# Patient Record
Sex: Male | Born: 1955 | Race: White | Hispanic: No | Marital: Married | Smoking: Never smoker
Health system: Southern US, Community
[De-identification: ages and names within clinical notes are randomized; demographics above are authoritative.]

## PROBLEM LIST (undated history)

## (undated) DIAGNOSIS — N200 Calculus of kidney: Secondary | ICD-10-CM

## (undated) DIAGNOSIS — M109 Gout, unspecified: Secondary | ICD-10-CM

## (undated) DIAGNOSIS — B370 Candidal stomatitis: Secondary | ICD-10-CM

## (undated) HISTORY — PX: BACK SURGERY: SHX140

## (undated) HISTORY — PX: ANTERIOR CERVICAL DECOMP/DISCECTOMY FUSION: SHX1161

## (undated) HISTORY — PX: KIDNEY STONE SURGERY: SHX686

---

## 2014-06-06 ENCOUNTER — Telehealth: Payer: Self-pay | Admitting: Internal Medicine

## 2014-06-06 NOTE — Telephone Encounter (Signed)
Closed encounter °

## 2014-06-09 ENCOUNTER — Emergency Department (HOSPITAL_COMMUNITY): Payer: BC Managed Care – PPO

## 2014-06-09 ENCOUNTER — Encounter (HOSPITAL_COMMUNITY): Payer: Self-pay | Admitting: Emergency Medicine

## 2014-06-09 ENCOUNTER — Emergency Department (HOSPITAL_COMMUNITY)
Admission: EM | Admit: 2014-06-09 | Discharge: 2014-06-10 | Disposition: A | Discharge status: Home / Self Care | Payer: BC Managed Care – PPO | Attending: Emergency Medicine | Admitting: Emergency Medicine

## 2014-06-09 DIAGNOSIS — R21 Rash and other nonspecific skin eruption: Secondary | ICD-10-CM | POA: Insufficient documentation

## 2014-06-09 DIAGNOSIS — R079 Chest pain, unspecified: Secondary | ICD-10-CM | POA: Insufficient documentation

## 2014-06-09 DIAGNOSIS — R059 Cough, unspecified: Secondary | ICD-10-CM

## 2014-06-09 DIAGNOSIS — R05 Cough: Secondary | ICD-10-CM

## 2014-06-09 DIAGNOSIS — Z79899 Other long term (current) drug therapy: Secondary | ICD-10-CM | POA: Insufficient documentation

## 2014-06-09 DIAGNOSIS — IMO0002 Reserved for concepts with insufficient information to code with codable children: Secondary | ICD-10-CM | POA: Diagnosis not present

## 2014-06-09 DIAGNOSIS — Z87442 Personal history of urinary calculi: Secondary | ICD-10-CM | POA: Insufficient documentation

## 2014-06-09 HISTORY — DX: Calculus of kidney: N20.0

## 2014-06-09 LAB — BASIC METABOLIC PANEL
Anion gap: 14 (ref 5–15)
BUN: 21 mg/dL (ref 6–23)
CALCIUM: 9.6 mg/dL (ref 8.4–10.5)
CHLORIDE: 98 meq/L (ref 96–112)
CO2: 26 mEq/L (ref 19–32)
Creatinine, Ser: 0.77 mg/dL (ref 0.50–1.35)
GFR calc Af Amer: >90 mL/min (ref >90)
GFR calc non Af Amer: >90 mL/min (ref >90)
Glucose, Bld: 220 mg/dL — ABNORMAL HIGH (ref 70–99)
Potassium: 5.1 mEq/L (ref 3.7–5.3)
Sodium: 138 mEq/L (ref 137–147)

## 2014-06-09 LAB — CBC
HEMATOCRIT: 47 % (ref 39.0–52.0)
Hemoglobin: 16.7 g/dL (ref 13.0–17.0)
MCH: 32.4 pg (ref 26.0–34.0)
MCHC: 35.5 g/dL (ref 30.0–36.0)
MCV: 91.1 fL (ref 78.0–100.0)
Platelets: 144 10*3/uL — ABNORMAL LOW (ref 150–400)
RBC: 5.16 MIL/uL (ref 4.22–5.81)
RDW: 12.7 % (ref 11.5–15.5)
WBC: 7.4 10*3/uL (ref 4.0–10.5)

## 2014-06-09 LAB — I-STAT TROPONIN, ED: Troponin i, poc: 0.01 ng/mL (ref 0.00–0.08)

## 2014-06-09 NOTE — ED Notes (Signed)
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) and was up in [place] working about 3 weeks ago and while he was there he started developing a rash on August 13th   Pt went to a walk in clinic on Aug 17th and got a steroid shot and 6 days worth of prednisone  Pt took 5 days of the prednisone due to side effects  Aug 22nd pt developed chills and muscle cramping all over his body   Pt went to [facility] around the 25th of August and was referred to a dermatologist  Pt had blood work for lyme disease and many other things all came back negative  Pt saw the dermatologist and was placed back on prednisone  Pt states during this whole time his chest has not been feeling right but no one has checked his chest they have concentrated on the rash and other symptoms  Pt states he has periods of shortness of breath and has had a dry nonproductive cough   Pt states today the chest discomfort has gotten worse

## 2014-06-10 ENCOUNTER — Emergency Department (HOSPITAL_COMMUNITY): Payer: BC Managed Care – PPO

## 2014-06-10 ENCOUNTER — Encounter (HOSPITAL_COMMUNITY): Payer: Self-pay | Admitting: Radiology

## 2014-06-10 LAB — D-DIMER, QUANTITATIVE (NOT AT ARMC): D DIMER QUANT: 0.9 ug{FEU}/mL — AB (ref 0.00–0.48)

## 2014-06-10 MED ORDER — DESLORATADINE 5 MG PO TABS: 5.0000 mg | ORAL_TABLET | Freq: Every day | ORAL | Status: DC

## 2014-06-10 MED ORDER — IOHEXOL 350 MG/ML SOLN
100.0000 mL | Freq: Once | INTRAVENOUS | Status: AC | PRN
  Administered 2014-06-10: 100 mL via INTRAVENOUS

## 2014-06-10 MED ORDER — IOHEXOL 300 MG/ML  SOLN: 100.0000 mL | Freq: Once | INTRAMUSCULAR | Status: DC | PRN

## 2014-06-10 MED ORDER — KETOROLAC TROMETHAMINE 30 MG/ML IJ SOLN
30.0000 mg | Freq: Once | INTRAMUSCULAR | Status: AC
  Administered 2014-06-10: 30 mg via INTRAVENOUS
  Filled 2014-06-10: qty 1

## 2014-06-10 MED ORDER — IBUPROFEN 800 MG PO TABS: 800.0000 mg | ORAL_TABLET | Freq: Three times a day (TID) | ORAL | Status: AC

## 2014-06-10 NOTE — Discharge Instructions (Signed)
Cough, Adult   A cough is a reflex. It helps you clear your throat and airways. A cough can help heal your body. A cough can last 2 or 3 weeks (acute) or may last more than 8 weeks (chronic). Some common causes of a cough can include an infection, allergy, or a cold.  HOME CARE  · Only take medicine as told by your doctor.  · If given, take your medicines (antibiotics) as told. Finish them even if you start to feel better.  · Use a cold steam vaporizer or humidifier in your home. This can help loosen thick spit (secretions).  · Sleep so you are almost sitting up (semi-upright). Use pillows to do this. This helps reduce coughing.  · Rest as needed.  · Stop smoking if you smoke.  GET HELP RIGHT AWAY IF:  · You have yellowish-white fluid (pus) in your thick spit.  · Your cough gets worse.  · Your medicine does not reduce coughing, and you are losing sleep.  · You cough up blood.  · You have trouble breathing.  · Your pain gets worse and medicine does not help.  · You have a fever.  MAKE SURE YOU:   · Understand these instructions.  · Will watch your condition.  · Will get help right away if you are not doing well or get worse.  Document Released: 06/02/2011 Document Revised: 02/03/2014 Document Reviewed: 06/02/2011  ExitCare® Patient Information ©2015 [employer]. This information is not intended to replace advice given to you by your health care provider. Make sure you discuss any questions you have with your health care provider.

## 2014-06-10 NOTE — ED Provider Notes (Signed)
CSN: [id number]     Arrival date & time 06/09/14  1936 History   First MD Initiated Contact with Patient 06/09/14 2350     Chief Complaint  Patient presents with  . Chest Pain     (Consider location/radiation/quality/duration/timing/severity/associated sxs/prior Treatment) Patient is a 58 y.o. male presenting with chest pain. The history is provided by the patient.  Chest Pain Pain location:  R chest Pain quality: aching   Pain radiates to:  Does not radiate Pain radiates to the back: no   Pain severity:  Mild Onset quality:  Gradual Duration:  2 days Timing:  Constant Progression:  Unchanged Chronicity:  New Context: breathing   Relieved by:  Nothing Worsened by:  Nothing tried Ineffective treatments:  None tried Associated symptoms: cough   Associated symptoms: no diaphoresis, no fever, no lower extremity edema and not vomiting   Associated symptoms comment:  Rashes on the skin x 3 weeks, seen by derm and on 3 weeks of prednisone Cough:    Cough characteristics:  Non-productive Risk factors: no aortic disease     Past Medical History  Diagnosis Date  . Kidney stone    Past Surgical History  Procedure Laterality Date  . Kindney stone surgery    . Cervical disc surgery     Family History  Problem Relation Age of Onset  . Diabetes Other    History  Substance Use Topics  . Smoking status: Never Smoker   . Smokeless tobacco: Not on file  . Alcohol Use: No    Review of Systems  Constitutional: Negative for fever and diaphoresis.  Respiratory: Positive for cough.   Cardiovascular: Positive for chest pain. Negative for leg swelling.  Gastrointestinal: Negative for vomiting.  Skin: Positive for rash.  All other systems reviewed and are negative.     Allergies  Review of patient's allergies indicates no known allergies.  Home Medications   Prior to Admission medications   Medication Sig Start Date End Date Taking? Authorizing Provider  acetaminophen  (TYLENOL) 325 MG tablet Take 650 mg by mouth 2 (two) times daily.   Yes Historical Provider, MD  mometasone (ELOCON) 0.1 % ointment Apply 1 application topically daily. To affected areas.   Yes Historical Provider, MD  predniSONE (DELTASONE) 5 MG tablet Take 5 mg by mouth daily with breakfast. Taper down beginning with 12 tablets for 2 days, 11 tabs x2 days, 10 x2 days...etc until gone.   Yes Historical Provider, MD   BP 128/80  Pulse 80  Temp(Src) 98.7 F (37.1 C) (Oral)  Resp 18  SpO2 95% Physical Exam  Constitutional: He is oriented to person, place, and time. He appears well-developed and well-nourished. No distress.  HENT:  Head: Normocephalic and atraumatic.  Mouth/Throat: Oropharynx is clear and moist.  Eyes: Conjunctivae are normal. Pupils are equal, round, and reactive to light.  Neck: Normal range of motion. Neck supple.  Cardiovascular: Normal rate, regular rhythm and intact distal pulses.   Pulmonary/Chest: Effort normal and breath sounds normal. No stridor. No respiratory distress. He has no wheezes. He has no rales.  Abdominal: Soft. Bowel sounds are normal. There is no tenderness. There is no rebound and no guarding.  Musculoskeletal: Normal range of motion. He exhibits no edema and no tenderness.  Neurological: He is alert and oriented to person, place, and time. He has normal reflexes.  Skin: Skin is warm and dry. Rash noted.  On elbows, red and scaly No osler nodes no janeway lesions.  No splinter  hemorrhages    ED Course  Procedures (including critical care time) Labs Review Labs Reviewed  CBC - Abnormal; Notable for the following:    Platelets 144 (*)    All other components within normal limits  BASIC METABOLIC PANEL - Abnormal; Notable for the following:    Glucose, Bld 220 (*)    All other components within normal limits  I-STAT TROPOININ, ED    Imaging Review Dg Chest 2 View  06/09/2014   CLINICAL DATA:  Sharp mid chest pain with inspiration  EXAM:  CHEST  2 VIEW  COMPARISON:  05/20/2010  FINDINGS: Mild bibasilar opacities, likely atelectasis. No pleural effusion or pneumothorax.  The heart is normal in size.  Mild degenerative changes of the visualized thoracolumbar spine. Cervical spine fixation hardware.  IMPRESSION: Mild bibasilar opacities, likely atelectasis.   Electronically Signed   By: Charline Bills M.D.   On: 06/09/2014 20:18     EKG Interpretation   Date/Time:  Monday June 09 2014 19:46:38 EDT Ventricular Rate:  84 PR Interval:  140 QRS Duration: 82 QT Interval:  354 QTC Calculation: 418 R Axis:   43 Text Interpretation:  Sinus rhythm Confirmed by [facility]  MD, Brylin Stanislawski  ([id number]) on 06/10/2014 12:26:58 AM      MDM   Final diagnoses:  None    Negative for PE, ruled out for ACS based on EKG and troponin and time course of > 8 hours of symptoms.  Will treat with NSAIDs and zyrtec and have patient follow up with PMD for ongoing testing of rash and symptoms.  Patient and wife verbalize understanding and agree to follow up    Glenn Sayegh Smitty Cords, MD 06/10/14 [phone]

## 2014-07-04 ENCOUNTER — Ambulatory Visit: Payer: BC Managed Care – PPO | Admitting: Infectious Diseases

## 2014-07-04 ENCOUNTER — Emergency Department (HOSPITAL_COMMUNITY)
Admission: EM | Admit: 2014-07-04 | Discharge: 2014-07-04 | Disposition: A | Discharge status: Home / Self Care | Payer: BC Managed Care – PPO | Attending: Emergency Medicine | Admitting: Emergency Medicine

## 2014-07-04 ENCOUNTER — Emergency Department (HOSPITAL_COMMUNITY): Payer: BC Managed Care – PPO

## 2014-07-04 ENCOUNTER — Encounter (HOSPITAL_COMMUNITY): Payer: Self-pay | Admitting: Emergency Medicine

## 2014-07-04 DIAGNOSIS — K1379 Other lesions of oral mucosa: Secondary | ICD-10-CM | POA: Insufficient documentation

## 2014-07-04 DIAGNOSIS — R21 Rash and other nonspecific skin eruption: Secondary | ICD-10-CM | POA: Diagnosis not present

## 2014-07-04 DIAGNOSIS — E860 Dehydration: Secondary | ICD-10-CM | POA: Diagnosis not present

## 2014-07-04 DIAGNOSIS — R945 Abnormal results of liver function studies: Secondary | ICD-10-CM | POA: Diagnosis not present

## 2014-07-04 DIAGNOSIS — Z87442 Personal history of urinary calculi: Secondary | ICD-10-CM | POA: Diagnosis not present

## 2014-07-04 DIAGNOSIS — Z791 Long term (current) use of non-steroidal anti-inflammatories (NSAID): Secondary | ICD-10-CM | POA: Diagnosis not present

## 2014-07-04 DIAGNOSIS — R7989 Other specified abnormal findings of blood chemistry: Secondary | ICD-10-CM

## 2014-07-04 DIAGNOSIS — R531 Weakness: Secondary | ICD-10-CM | POA: Diagnosis present

## 2014-07-04 DIAGNOSIS — Z79899 Other long term (current) drug therapy: Secondary | ICD-10-CM | POA: Diagnosis not present

## 2014-07-04 LAB — URINALYSIS, ROUTINE W REFLEX MICROSCOPIC
Glucose, UA: NEGATIVE mg/dL
HGB URINE DIPSTICK: NEGATIVE
Ketones, ur: 15 mg/dL — AB
Leukocytes, UA: NEGATIVE
Nitrite: NEGATIVE
Protein, ur: 100 mg/dL — AB
Specific Gravity, Urine: 1.023 (ref 1.005–1.030)
UROBILINOGEN UA: 2 mg/dL — AB (ref 0.0–1.0)
pH: 6 (ref 5.0–8.0)

## 2014-07-04 LAB — CBC WITH DIFFERENTIAL/PLATELET
BASOS PCT: 1 % (ref 0–1)
Basophils Absolute: 0.1 10*3/uL (ref 0.0–0.1)
Eosinophils Absolute: 0.2 10*3/uL (ref 0.0–0.7)
Eosinophils Relative: 4 % (ref 0–5)
HEMATOCRIT: 39.9 % (ref 39.0–52.0)
Hemoglobin: 14.2 g/dL (ref 13.0–17.0)
LYMPHS PCT: 18 % (ref 12–46)
Lymphs Abs: 1.1 10*3/uL (ref 0.7–4.0)
MCH: 32.3 pg (ref 26.0–34.0)
MCHC: 35.6 g/dL (ref 30.0–36.0)
MCV: 90.9 fL (ref 78.0–100.0)
MONOS PCT: 16 % — AB (ref 3–12)
Monocytes Absolute: 1 10*3/uL (ref 0.1–1.0)
NEUTROS ABS: 3.7 10*3/uL (ref 1.7–7.7)
Neutrophils Relative %: 61 % (ref 43–77)
Platelets: 152 10*3/uL (ref 150–400)
RBC: 4.39 MIL/uL (ref 4.22–5.81)
RDW: 13.6 % (ref 11.5–15.5)
WBC: 6.1 10*3/uL (ref 4.0–10.5)

## 2014-07-04 LAB — COMPREHENSIVE METABOLIC PANEL
ALT: 151 U/L — AB (ref 0–53)
AST: 265 U/L — ABNORMAL HIGH (ref 0–37)
Albumin: 2.3 g/dL — ABNORMAL LOW (ref 3.5–5.2)
Alkaline Phosphatase: 129 U/L — ABNORMAL HIGH (ref 39–117)
Anion gap: 10 (ref 5–15)
BUN: 13 mg/dL (ref 6–23)
CO2: 26 meq/L (ref 19–32)
Calcium: 8.4 mg/dL (ref 8.4–10.5)
Chloride: 99 mEq/L (ref 96–112)
Creatinine, Ser: 0.83 mg/dL (ref 0.50–1.35)
GFR calc non Af Amer: >90 mL/min (ref >90)
GLUCOSE: 116 mg/dL — AB (ref 70–99)
POTASSIUM: 3.9 meq/L (ref 3.7–5.3)
SODIUM: 135 meq/L — AB (ref 137–147)
Total Bilirubin: 0.9 mg/dL (ref 0.3–1.2)
Total Protein: 6.4 g/dL (ref 6.0–8.3)

## 2014-07-04 LAB — URINE MICROSCOPIC-ADD ON

## 2014-07-04 MED ORDER — GI COCKTAIL ~~LOC~~
30.0000 mL | Freq: Once | ORAL | Status: AC
  Administered 2014-07-04: 30 mL via ORAL
  Filled 2014-07-04: qty 30

## 2014-07-04 MED ORDER — IOHEXOL 300 MG/ML  SOLN
100.0000 mL | Freq: Once | INTRAMUSCULAR | Status: AC | PRN
  Administered 2014-07-04: 100 mL via INTRAVENOUS

## 2014-07-04 MED ORDER — DIPHENHYDRAMINE HCL 50 MG/ML IJ SOLN
25.0000 mg | Freq: Once | INTRAMUSCULAR | Status: AC
  Administered 2014-07-04: 25 mg via INTRAVENOUS
  Filled 2014-07-04: qty 1

## 2014-07-04 MED ORDER — IOHEXOL 300 MG/ML  SOLN
25.0000 mL | INTRAMUSCULAR | Status: AC
  Administered 2014-07-04: 25 mL via ORAL

## 2014-07-04 MED ORDER — SODIUM CHLORIDE 0.9 % IV BOLUS (SEPSIS)
2000.0000 mL | Freq: Once | INTRAVENOUS | Status: AC
  Administered 2014-07-04: 2000 mL via INTRAVENOUS

## 2014-07-04 NOTE — Discharge Instructions (Signed)
Stay hydrated.   Continue current meds.   Follow up with your specialists for further workup.   Return to ER if you have fever, worse rash, dehydration, vomiting.

## 2014-07-04 NOTE — ED Notes (Signed)
Oral contrast completed.  CT called.  

## 2014-07-04 NOTE — ED Notes (Signed)
Pt from home for rash to back, bilateral arms, and legs x months. Pt states that he also has sores in his mouth causing him not to be able to eat or drink. Reports some weakness and fatigue. Pt also reports some nausea. Denies any fevers. Nad noted.

## 2014-07-04 NOTE — ED Notes (Signed)
Pt in stating he has had multiple symptoms over the last 5 weeks and has been to several specialists for this, c/o mouth sores, fatigue, generalized rash, has been told he may have coxsackie virus. Pt has been losing weight, decreased PO intake, pt alert and oriented. Pt here for further testing.

## 2014-07-04 NOTE — ED Provider Notes (Signed)
CSN: [id number]     Arrival date & time 07/04/14  1339 History   First MD Initiated Contact with Patient 07/04/14 1441     Chief Complaint  Patient presents with  . Mouth Lesions  . Weakness     (Consider location/radiation/quality/duration/timing/severity/associated sxs/prior Treatment) The history is provided by the patient.  Glenn Cooper is a 58 y.o. male hx of kidney stone here with rash. Rash on torso and back and mouth for the last 5-6 weeks. He has been seen multiple times with several specialists, including ID and rheumatologist. He has negative lyme titers and had elevated LFTs. He also had neg HIV test and neg mono. He has mouth sores that are not getting better despite being on magic mouth wash. He is unable to eat much and feels fatigued. States about 10 pound weight loss over the last month. He was told that he might have coxsackie virus. About 2 weeks ago, he was diagnosed with sinusitis and is currently on augmentin.    Past Medical History  Diagnosis Date  . Kidney stone    Past Surgical History  Procedure Laterality Date  . Kindney stone surgery    . Cervical disc surgery     Family History  Problem Relation Age of Onset  . Diabetes Other    History  Substance Use Topics  . Smoking status: Never Smoker   . Smokeless tobacco: Not on file  . Alcohol Use: No    Review of Systems  HENT: Positive for mouth sores.   Skin: Positive for rash.  Neurological: Positive for weakness.  All other systems reviewed and are negative.     Allergies  Review of patient's allergies indicates no known allergies.  Home Medications   Prior to Admission medications   Medication Sig Start Date End Date Taking? Authorizing Provider  acetaminophen (TYLENOL) 325 MG tablet Take 650 mg by mouth every 4 (four) hours as needed for mild pain.    Yes Historical Provider, MD  Alum & Mag Hydroxide-Simeth (MAGIC MOUTHWASH W/LIDOCAINE) SOLN Take 10 mLs by mouth 3 (three) times daily as  needed for mouth pain.   Yes Historical Provider, MD  amoxicillin-clavulanate (AUGMENTIN) 875-125 MG per tablet Take 1 tablet by mouth 2 (two) times daily. For 7 days 06/27/14  Yes Historical Provider, MD  benzonatate (TESSALON) 200 MG capsule Take 200 mg by mouth 3 (three) times daily as needed for cough.   Yes Historical Provider, MD  desloratadine (CLARINEX) 5 MG tablet Take 5 mg by mouth daily as needed (allergies).   Yes Historical Provider, MD  guaiFENesin (MUCINEX) 600 MG 12 hr tablet Take 600 mg by mouth 2 (two) times daily as needed for cough.   Yes Historical Provider, MD  ibuprofen (ADVIL,MOTRIN) 800 MG tablet Take 1 tablet (800 mg total) by mouth 3 (three) times daily. 06/10/14  Yes April K Palumbo-Rasch, MD  silver sulfADIAZINE (SILVADENE) 1 % cream Apply 1 application topically 2 (two) times daily.   Yes Historical Provider, MD   BP 113/95  Pulse 87  Temp(Src) 98.8 F (37.1 C) (Oral)  Resp 22  Ht 6\' 4"  (1.93 m)  Wt 214 lb (97.07 kg)  BMI 26.06 kg/m2  SpO2 95% Physical Exam  Nursing note and vitals reviewed. Constitutional: He is oriented to person, place, and time.  Dehydrated   HENT:  Head: Normocephalic.  MM slightly dry. Several oral lesions.   Eyes: Conjunctivae are normal. Pupils are equal, round, and reactive to light.  Neck: Normal  range of motion. Neck supple.  Cardiovascular: Normal rate, regular rhythm and normal heart sounds.   Pulmonary/Chest: Effort normal and breath sounds normal. No respiratory distress. He has no wheezes. He has no rales.  Abdominal: Soft. Bowel sounds are normal. He exhibits no distension. There is no tenderness. There is no rebound.  Musculoskeletal: Normal range of motion. He exhibits no edema and no tenderness.  Neurological: He is alert and oriented to person, place, and time.  Skin:  Diffuse macular papular rash, no desquamation.   Psychiatric: He has a normal mood and affect. His behavior is normal. Judgment and thought content  normal.    ED Course  Procedures (including critical care time) Labs Review Labs Reviewed  CBC WITH DIFFERENTIAL - Abnormal; Notable for the following:    Monocytes Relative 16 (*)    All other components within normal limits  COMPREHENSIVE METABOLIC PANEL - Abnormal; Notable for the following:    Sodium 135 (*)    Glucose, Bld 116 (*)    Albumin 2.3 (*)    AST 265 (*)    ALT 151 (*)    Alkaline Phosphatase 129 (*)    All other components within normal limits  URINALYSIS, ROUTINE W REFLEX MICROSCOPIC - Abnormal; Notable for the following:    Color, Urine AMBER (*)    Bilirubin Urine SMALL (*)    Ketones, ur 15 (*)    Protein, ur 100 (*)    Urobilinogen, UA 2.0 (*)    All other components within normal limits  URINE MICROSCOPIC-ADD ON - Abnormal; Notable for the following:    Squamous Epithelial / LPF FEW (*)    Bacteria, UA FEW (*)    Casts GRANULAR CAST (*)    All other components within normal limits  MONONUCLEOSIS SCREEN    Imaging Review Ct Abdomen Pelvis W Contrast  07/04/2014   CLINICAL DATA:  Lower bilateral pain. Elevated liver function tests.  EXAM: CT ABDOMEN AND PELVIS WITH CONTRAST  TECHNIQUE: Multidetector CT imaging of the abdomen and pelvis was performed using the standard protocol following bolus administration of intravenous contrast.  CONTRAST:  100mL OMNIPAQUE IOHEXOL 300 MG/ML  SOLN  COMPARISON:  07/09/2012  FINDINGS: Lower Chest:  Unremarkable.  Hepatobiliary: Several tiny sub-cm hepatic cysts remain stable. No liver masses are identified. Focal fatty infiltration seen adjacent to the falciform ligament. Gallbladder is unremarkable.  Pancreas: No mass, inflammatory changes, or other parenchymal abnormality identified.  Spleen:  Within normal limits in size and appearance.  Adrenal Glands:  No mass identified.  Kidneys/Urinary Tract: No masses identified. A few tiny less than 5 mm nonobstructive calculi are seen in the upper and midpole of the right kidney. No  evidence of hydronephrosis. No evidence of ureteral calculi or dilatation.  Stomach/Bowel/Peritoneum: No evidence of wall thickening, mass, or obstruction.  Vascular/Lymphatic: No pathologically enlarged lymph nodes identified. No other significant abnormality identified.  Reproductive:  No mass or other significant abnormality identified.  Other:  None.  Musculoskeletal:  No suspicious bone lesions identified.  IMPRESSION: No acute findings within the abdomen or pelvis.  Nonobstructive right nephrolithiasis.   Electronically Signed   By: Myles RosenthalJohn  Stahl M.D.   On: 07/04/2014 16:10     EKG Interpretation None      MDM   Final diagnoses:  None   Miguel Janee Mornhompson is a 58 y.o. male here with rash, elevated LFTs. I doubt steven johnson's syndrome. Given weight loss and elevated LFTs, will get CT ab/pel to r/o occult cancer.  Will hydrate and reassess.   4:22 PM CT unremarkable. LFTs slightly elevated, abdomen nontender. Hydrated in the ED, felt better. Will d/c home.    Richardean Canal, MD 07/04/14 [phone]

## 2014-07-04 NOTE — ED Notes (Signed)
Brought pt back to room with family in tow; pt given a gown to get in to; Duwayne Heckanielle, RN, Felipa Etherrence, RN and Silverio LayYao, MD present in room

## 2014-07-08 ENCOUNTER — Encounter (HOSPITAL_COMMUNITY): Payer: Self-pay | Admitting: Emergency Medicine

## 2014-07-08 ENCOUNTER — Inpatient Hospital Stay (HOSPITAL_COMMUNITY): Payer: BC Managed Care – PPO

## 2014-07-08 ENCOUNTER — Inpatient Hospital Stay (HOSPITAL_COMMUNITY)
Admission: EM | Admit: 2014-07-08 | Discharge: 2014-07-11 | DRG: 640 | Disposition: A | Discharge status: Home / Self Care | Payer: BC Managed Care – PPO | Attending: Family Medicine | Admitting: Family Medicine

## 2014-07-08 ENCOUNTER — Encounter: Payer: Self-pay | Admitting: Internal Medicine

## 2014-07-08 ENCOUNTER — Ambulatory Visit (INDEPENDENT_AMBULATORY_CARE_PROVIDER_SITE_OTHER): Payer: BC Managed Care – PPO | Admitting: Internal Medicine

## 2014-07-08 VITALS — BP 81/63 | HR 96 | Ht 73.0 in | Wt 212.1 lb

## 2014-07-08 DIAGNOSIS — Z6828 Body mass index (BMI) 28.0-28.9, adult: Secondary | ICD-10-CM | POA: Diagnosis not present

## 2014-07-08 DIAGNOSIS — Z809 Family history of malignant neoplasm, unspecified: Secondary | ICD-10-CM

## 2014-07-08 DIAGNOSIS — R0609 Other forms of dyspnea: Secondary | ICD-10-CM

## 2014-07-08 DIAGNOSIS — B370 Candidal stomatitis: Secondary | ICD-10-CM | POA: Diagnosis present

## 2014-07-08 DIAGNOSIS — I959 Hypotension, unspecified: Secondary | ICD-10-CM | POA: Diagnosis present

## 2014-07-08 DIAGNOSIS — E43 Unspecified severe protein-calorie malnutrition: Secondary | ICD-10-CM | POA: Diagnosis present

## 2014-07-08 DIAGNOSIS — Z87442 Personal history of urinary calculi: Secondary | ICD-10-CM

## 2014-07-08 DIAGNOSIS — R634 Abnormal weight loss: Secondary | ICD-10-CM

## 2014-07-08 DIAGNOSIS — R5383 Other fatigue: Secondary | ICD-10-CM

## 2014-07-08 DIAGNOSIS — M109 Gout, unspecified: Secondary | ICD-10-CM | POA: Diagnosis present

## 2014-07-08 DIAGNOSIS — M3390 Dermatopolymyositis, unspecified, organ involvement unspecified: Secondary | ICD-10-CM | POA: Diagnosis present

## 2014-07-08 DIAGNOSIS — I951 Orthostatic hypotension: Secondary | ICD-10-CM

## 2014-07-08 DIAGNOSIS — Z833 Family history of diabetes mellitus: Secondary | ICD-10-CM | POA: Diagnosis not present

## 2014-07-08 DIAGNOSIS — R21 Rash and other nonspecific skin eruption: Secondary | ICD-10-CM

## 2014-07-08 DIAGNOSIS — J189 Pneumonia, unspecified organism: Secondary | ICD-10-CM | POA: Diagnosis present

## 2014-07-08 DIAGNOSIS — Z981 Arthrodesis status: Secondary | ICD-10-CM

## 2014-07-08 DIAGNOSIS — E860 Dehydration: Secondary | ICD-10-CM | POA: Diagnosis not present

## 2014-07-08 DIAGNOSIS — I517 Cardiomegaly: Secondary | ICD-10-CM

## 2014-07-08 DIAGNOSIS — R079 Chest pain, unspecified: Secondary | ICD-10-CM

## 2014-07-08 DIAGNOSIS — Z8719 Personal history of other diseases of the digestive system: Secondary | ICD-10-CM

## 2014-07-08 DIAGNOSIS — R0600 Dyspnea, unspecified: Secondary | ICD-10-CM

## 2014-07-08 HISTORY — DX: Gout, unspecified: M10.9

## 2014-07-08 HISTORY — DX: Candidal stomatitis: B37.0

## 2014-07-08 LAB — COMPREHENSIVE METABOLIC PANEL
ALBUMIN: 2.3 g/dL — AB (ref 3.5–5.2)
ALT: 103 U/L — AB (ref 0–53)
AST: 191 U/L — AB (ref 0–37)
Alkaline Phosphatase: 123 U/L — ABNORMAL HIGH (ref 39–117)
Anion gap: 12 (ref 5–15)
BUN: 12 mg/dL (ref 6–23)
CALCIUM: 8 mg/dL — AB (ref 8.4–10.5)
CHLORIDE: 98 meq/L (ref 96–112)
CO2: 23 meq/L (ref 19–32)
Creatinine, Ser: 0.87 mg/dL (ref 0.50–1.35)
GFR calc Af Amer: >90 mL/min (ref >90)
GFR calc non Af Amer: >90 mL/min (ref >90)
Glucose, Bld: 116 mg/dL — ABNORMAL HIGH (ref 70–99)
Potassium: 3.5 mEq/L — ABNORMAL LOW (ref 3.7–5.3)
SODIUM: 133 meq/L — AB (ref 137–147)
Total Bilirubin: 1 mg/dL (ref 0.3–1.2)
Total Protein: 6.2 g/dL (ref 6.0–8.3)

## 2014-07-08 LAB — PRO B NATRIURETIC PEPTIDE: PRO B NATRI PEPTIDE: 22.7 pg/mL (ref 0–125)

## 2014-07-08 LAB — CBC WITH DIFFERENTIAL/PLATELET
Basophils Absolute: 0 10*3/uL (ref 0.0–0.1)
Basophils Relative: 1 % (ref 0–1)
EOS ABS: 0.1 10*3/uL (ref 0.0–0.7)
EOS PCT: 2 % (ref 0–5)
HCT: 40.8 % (ref 39.0–52.0)
Hemoglobin: 14.3 g/dL (ref 13.0–17.0)
LYMPHS ABS: 0.8 10*3/uL (ref 0.7–4.0)
Lymphocytes Relative: 13 % (ref 12–46)
MCH: 31.5 pg (ref 26.0–34.0)
MCHC: 35 g/dL (ref 30.0–36.0)
MCV: 89.9 fL (ref 78.0–100.0)
Monocytes Absolute: 1 10*3/uL (ref 0.1–1.0)
Monocytes Relative: 16 % — ABNORMAL HIGH (ref 3–12)
Neutro Abs: 4.7 10*3/uL (ref 1.7–7.7)
Neutrophils Relative %: 70 % (ref 43–77)
Platelets: 137 10*3/uL — ABNORMAL LOW (ref 150–400)
RBC: 4.54 MIL/uL (ref 4.22–5.81)
RDW: 13.9 % (ref 11.5–15.5)
WBC: 6.7 10*3/uL (ref 4.0–10.5)

## 2014-07-08 LAB — URINALYSIS, ROUTINE W REFLEX MICROSCOPIC
GLUCOSE, UA: NEGATIVE mg/dL
HGB URINE DIPSTICK: NEGATIVE
KETONES UR: 40 mg/dL — AB
Leukocytes, UA: NEGATIVE
Nitrite: NEGATIVE
PH: 6.5 (ref 5.0–8.0)
Protein, ur: 100 mg/dL — AB
Specific Gravity, Urine: 1.021 (ref 1.005–1.030)
Urobilinogen, UA: 4 mg/dL — ABNORMAL HIGH (ref 0.0–1.0)

## 2014-07-08 LAB — HEPATITIS PANEL, ACUTE
HCV AB: NEGATIVE
HEP A IGM: NONREACTIVE
Hep B C IgM: NONREACTIVE
Hepatitis B Surface Ag: NEGATIVE

## 2014-07-08 LAB — HEMOGLOBIN A1C
Hgb A1c MFr Bld: 6.5 % — ABNORMAL HIGH (ref <5.7)
Mean Plasma Glucose: 140 mg/dL — ABNORMAL HIGH (ref <117)

## 2014-07-08 LAB — URINE MICROSCOPIC-ADD ON

## 2014-07-08 LAB — CBC
HEMATOCRIT: 37.8 % — AB (ref 39.0–52.0)
Hemoglobin: 13 g/dL (ref 13.0–17.0)
MCH: 31.8 pg (ref 26.0–34.0)
MCHC: 34.4 g/dL (ref 30.0–36.0)
MCV: 92.4 fL (ref 78.0–100.0)
Platelets: 120 10*3/uL — ABNORMAL LOW (ref 150–400)
RBC: 4.09 MIL/uL — ABNORMAL LOW (ref 4.22–5.81)
RDW: 14.2 % (ref 11.5–15.5)
WBC: 5.1 10*3/uL (ref 4.0–10.5)

## 2014-07-08 LAB — CK: CK TOTAL: 235 U/L — AB (ref 7–232)

## 2014-07-08 LAB — CREATININE, SERUM
CREATININE: 0.73 mg/dL (ref 0.50–1.35)
GFR calc Af Amer: >90 mL/min (ref >90)
GFR calc non Af Amer: >90 mL/min (ref >90)

## 2014-07-08 LAB — SAVE SMEAR

## 2014-07-08 LAB — HIV ANTIBODY (ROUTINE TESTING W REFLEX): HIV: NONREACTIVE

## 2014-07-08 LAB — SEDIMENTATION RATE: Sed Rate: 30 mm/hr — ABNORMAL HIGH (ref 0–16)

## 2014-07-08 LAB — C-REACTIVE PROTEIN: CRP: <0.5 mg/dL — ABNORMAL LOW (ref <0.60)

## 2014-07-08 LAB — TSH: TSH: 2.25 u[IU]/mL (ref 0.350–4.500)

## 2014-07-08 LAB — I-STAT CG4 LACTIC ACID, ED: Lactic Acid, Venous: 1.49 mmol/L (ref 0.5–2.2)

## 2014-07-08 MED ORDER — POTASSIUM CHLORIDE CRYS ER 20 MEQ PO TBCR
40.0000 meq | EXTENDED_RELEASE_TABLET | Freq: Two times a day (BID) | ORAL | Status: DC
  Administered 2014-07-08 – 2014-07-10 (×5): 40 meq via ORAL
  Filled 2014-07-08 (×8): qty 2

## 2014-07-08 MED ORDER — HEPARIN SODIUM (PORCINE) 5000 UNIT/ML IJ SOLN
5000.0000 [IU] | Freq: Three times a day (TID) | INTRAMUSCULAR | Status: DC
  Administered 2014-07-08 – 2014-07-11 (×9): 5000 [IU] via SUBCUTANEOUS
  Filled 2014-07-08 (×13): qty 1

## 2014-07-08 MED ORDER — ONDANSETRON HCL 4 MG/2ML IJ SOLN: 4.0000 mg | Freq: Four times a day (QID) | INTRAMUSCULAR | Status: DC | PRN

## 2014-07-08 MED ORDER — AZITHROMYCIN 250 MG PO TABS
250.0000 mg | ORAL_TABLET | Freq: Every day | ORAL | Status: DC
  Filled 2014-07-08: qty 1

## 2014-07-08 MED ORDER — AZITHROMYCIN 500 MG PO TABS
500.0000 mg | ORAL_TABLET | Freq: Once | ORAL | Status: AC
  Administered 2014-07-09: 500 mg via ORAL
  Filled 2014-07-08: qty 1

## 2014-07-08 MED ORDER — LIDOCAINE VISCOUS 2 % MT SOLN
15.0000 mL | OROMUCOSAL | Status: DC | PRN
Start: 2014-07-08 — End: 2014-07-08

## 2014-07-08 MED ORDER — ACETAMINOPHEN 325 MG PO TABS: 650.0000 mg | ORAL_TABLET | Freq: Four times a day (QID) | ORAL | Status: DC | PRN

## 2014-07-08 MED ORDER — SODIUM CHLORIDE 0.9 % IV SOLN
INTRAVENOUS | Status: DC
  Administered 2014-07-08: 15:00:00 via INTRAVENOUS

## 2014-07-08 MED ORDER — SODIUM CHLORIDE 0.9 % IV BOLUS (SEPSIS)
1000.0000 mL | Freq: Once | INTRAVENOUS | Status: AC
  Administered 2014-07-08: 1000 mL via INTRAVENOUS

## 2014-07-08 MED ORDER — SODIUM CHLORIDE 0.45 % IV SOLN
INTRAVENOUS | Status: DC
  Administered 2014-07-08: 100 mL/h via INTRAVENOUS
  Administered 2014-07-09: 07:00:00 via INTRAVENOUS
  Administered 2014-07-09: 100 mL/h via INTRAVENOUS

## 2014-07-08 MED ORDER — ONDANSETRON HCL 4 MG PO TABS: 4.0000 mg | ORAL_TABLET | Freq: Four times a day (QID) | ORAL | Status: DC | PRN

## 2014-07-08 MED ORDER — ACETAMINOPHEN 650 MG RE SUPP: 650.0000 mg | Freq: Four times a day (QID) | RECTAL | Status: DC | PRN

## 2014-07-08 MED ORDER — DOCUSATE SODIUM 100 MG PO CAPS
100.0000 mg | ORAL_CAPSULE | Freq: Two times a day (BID) | ORAL | Status: DC | PRN
Start: 2014-07-08 — End: 2014-07-11

## 2014-07-08 MED ORDER — LIDOCAINE VISCOUS 2 % MT SOLN
15.0000 mL | OROMUCOSAL | Status: DC | PRN
  Administered 2014-07-09: 15 mL via OROMUCOSAL
  Filled 2014-07-08: qty 15

## 2014-07-08 NOTE — ED Notes (Signed)
Pt sent here by cardiologist for eval of hypotension; pt with possible autoimmune disorder x several months with rash and thrush type rash in mouth; pt not eating with 25lb weight loss

## 2014-07-08 NOTE — Patient Instructions (Signed)
Please go to [facility] for evaluation

## 2014-07-08 NOTE — H&P (Signed)
[facility] Admission History and Physical Service Pager: [phone]  Patient name: Glenn Cooper Medical record number: [id number] Date of birth: 04-15-1956 Age: 58 y.o. Gender: male  Primary Care Provider: Reita Cliche, MD Consultants: None Code Status: Full Code  Chief Complaint: Weakness, fatigue, rash; found to be hypotensive.  Assessment and Plan: Glenn Cooper is a 58 y.o. male presenting with hypotension.  PMH is significant for Cervical disc surgery, and Kidney stone.  Hypotension - Unclear etiology in the setting of systemic illness.  No fever, tachycardia or elevated white count to suggest sepsis. Lactic acid negative. Could be secondary to volume overload given lung exam, although patient appears to be clinically euvolemic.  DDx is very broad and includes drug/toxin exposure, adrenal insuffiencey, hypovolemia, cardiogenic in nature. - Will admit to Tele, attending Dr. Erin Hearing - Hypotension currently improved following fluids. - Holding additional IV fluids at this time secondary to abnormal lung exam. - Obtaining further workup: A.m. Cortisol, TSH, BNP, echo, chest x-ray. - Will continue to monitor closely.   Rash with mucosal involvement; Acute systemic illness of unknown etiology - Unclear etiology; Awaiting skin biopsy results - Given presentation to be secondary to autoimmune conditions.  Will therefore proceed with additional workup with ANA.  - Viscous lidocaine to help with mouth ulcerations and to aid in PO intake. - Obtaining HIV ((results not available to me); Also obtaining CRP and ESR  Elevated LFT's & Alk phos - Recent CT abdomen negative - Acute hepatitis panel (results not available to me) - Will continue to trend  Fatigue and weight loss - secondary to acute/systemic illness. - Nutrition consult - Heart healthy diet - Work up as above  FEN/GI: Heart Healthy Diet; KVO fluids. Prophylaxis: Heparin SQ  Disposition: Tele  Pending clinical improvement.   History of Present Illness:  Glenn Cooper is a 58 y.o. male with a PMH of kidney stone presents from the Cardiology office with hypotension.  Patient reports he has not been feeling well for approximately 5 weeks.  Patient reports that 5 weeks ago he developed diffuse rash, mouth ulcers, and severe fatigue.  He has seen his primary care physician as well as several specialists over this period of time and no one has been able to figure out the underlying problem.    Patient also endorses some shortness of breath particularly with exertion.  No recent chest pain.  He denies any abdominal pain, nausea, vomiting. He does report poor PO intake which he attributes to the ulcerations in his mouth.  Per his wife, patient has had a lot of workup/evaluation: She reports negative HIV, negative hepatitis panel, negative Lyme and RMSF, dermatologist performed a skin biopsy and results are pending, EBV titers, etc.   Of note patient has been recently treated for a sinus infection but his rash began prior to that.  ? Exposures as he works in [employer] and is frequently in crawl spaces.  Patient endorsing 25 pound weight loss over this period of time.  Review Of Systems: Per HPI with the following additions: No chest pain, nausea, vomiting, constipation, reports that he has had diarrhea a few times. No rick contacts.   Otherwise 12 point review of systems was performed and was unremarkable.  Patient Active Problem List   Diagnosis Date Noted  . Orthostatic hypotension 07/08/2014  . Unintentional weight loss 07/08/2014  . DOE (dyspnea on exertion) 07/08/2014  . Oral thrush 07/08/2014   Past Medical History: Past Medical History  Diagnosis Date  .  Kidney stone    Past Surgical History: Past Surgical History  Procedure Laterality Date  . Kindney stone surgery    . Cervical disc surgery     Social History: History  Substance Use Topics  . Smoking status:  Never Smoker   . Smokeless tobacco: Not on file  . Alcohol Use: No   Additional social history: Drinks occasionally (1 beer every few weeks).  Family History: Family History  Problem Relation Age of Onset  . Diabetes Other   . Cancer Father     lung   Allergies and Medications: No Known Allergies No current facility-administered medications on file prior to encounter.   Current Outpatient Prescriptions on File Prior to Encounter  Medication Sig Dispense Refill  . Alum & Mag Hydroxide-Simeth (MAGIC MOUTHWASH W/LIDOCAINE) SOLN Take 10 mLs by mouth 3 (three) times daily as needed for mouth pain.      Marland Kitchen ibuprofen (ADVIL,MOTRIN) 800 MG tablet Take 1 tablet (800 mg total) by mouth 3 (three) times daily.  21 tablet  0  . silver sulfADIAZINE (SILVADENE) 1 % cream Apply 1 application topically 2 (two) times daily.        Objective: BP 97/59  Pulse 85  Temp(Src) 98 F (36.7 C) (Oral)  Resp 17  SpO2 97% Exam: General: pale appearing gentleman resting in bed, [place] O2 in place; NAD.  HEENT: NCAT. MMM.  Ulcerations noted on both sides of the tongue.  1 additional ulceration noted in the mouth. Oropharynx clear. No scleral icterus noted.   Cardiovascular: RRR. No m/r/g.  Respiratory: Coarse bibasilar rales noted extending up to mid back (posterior).  No wheezing noted.  Abdomen: soft, nontender, nondistended. No palpable organomegaly. Extremities: Trace LE edema.  Skin: diffuse erythematous papular rash noted - predominantly on the trunk and arms.  Desquamation noted around the finger tips.  Few scattered lesions noted on the palmar aspect of the hands and fingers.  Neuro: No focal deficits. AO x 3.  Labs and Imaging: CBC BMET   Recent Labs Lab 07/08/14 1035  WBC 6.7  HGB 14.3  HCT 40.8  PLT 137*    Recent Labs Lab 07/08/14 1035  NA 133*  K 3.5*  CL 98  CO2 23  BUN 12  CREATININE 0.87  GLUCOSE 116*  CALCIUM 8.0*     Ct Angio Chest Pe W/cm &/or Wo Cm 06/10/2014   IMPRESSION: No evidence of significant pulmonary embolus.    Ct Abdomen Pelvis W Contrast 07/04/2014   IMPRESSION: No acute findings within the abdomen or pelvis.  Nonobstructive right nephrolithiasis.     Coral Spikes, DO 07/08/2014, 12:47 PM PGY-3, [place] [facility] pager: [phone], text pages welcome

## 2014-07-08 NOTE — Progress Notes (Signed)
  Echocardiogram 2D Echocardiogram has been performed.  Arvil ChacoFoster, Ladarian Bonczek 07/08/2014, 3:10 PM

## 2014-07-08 NOTE — ED Provider Notes (Signed)
CSN: [id number]     Arrival date & time 07/08/14  1006 History   First MD Initiated Contact with Patient 07/08/14 1020     Chief Complaint  Patient presents with  . Hypotension      HPI Pt sent here by cardiologist for eval of hypotension; pt with possible autoimmune disorder x several months with rash and thrush type rash in mouth; pt not eating with 25lb weight loss  Past Medical History  Diagnosis Date  . Kidney stone    Past Surgical History  Procedure Laterality Date  . Kindney stone surgery    . Cervical disc surgery     Family History  Problem Relation Age of Onset  . Diabetes Other   . Cancer Father     lung   History  Substance Use Topics  . Smoking status: Never Smoker   . Smokeless tobacco: Not on file  . Alcohol Use: No    Review of Systems  Constitutional: Positive for activity change, appetite change, fatigue and unexpected weight change. Negative for fever.  Neurological: Positive for light-headedness.  All other systems reviewed and are negative.     Allergies  Review of patient's allergies indicates no known allergies.  Home Medications   Prior to Admission medications   Medication Sig Start Date End Date Taking? Authorizing Provider  Alum & Mag Hydroxide-Simeth (MAGIC MOUTHWASH W/LIDOCAINE) SOLN Take 10 mLs by mouth 3 (three) times daily as needed for mouth pain.   Yes Historical Provider, MD  ibuprofen (ADVIL,MOTRIN) 800 MG tablet Take 1 tablet (800 mg total) by mouth 3 (three) times daily. 06/10/14  Yes April K Palumbo-Rasch, MD  silver sulfADIAZINE (SILVADENE) 1 % cream Apply 1 application topically 2 (two) times daily.   Yes Historical Provider, MD   BP 98/66  Pulse 83  Temp(Src) 98 F (36.7 C) (Oral)  Resp 20  SpO2 97% Physical Exam  Nursing note and vitals reviewed. Constitutional: He is oriented to person, place, and time. He appears well-developed and well-nourished. No distress.  HENT:  Head: Normocephalic and atraumatic.   Mouth/Throat: Oral lesions present.  Eyes: Pupils are equal, round, and reactive to light.  Neck: Normal range of motion.  Cardiovascular: Normal rate and intact distal pulses.   Pulmonary/Chest: No respiratory distress.  Abdominal: Normal appearance. He exhibits no distension. There is no tenderness. There is no rebound.  Musculoskeletal: Normal range of motion.  Neurological: He is alert and oriented to person, place, and time. No cranial nerve deficit.  Skin: Skin is warm and dry. Rash noted.  Psychiatric: He has a normal mood and affect. His behavior is normal.    ED Course  Procedures (including critical care time)  CRITICAL CARE Performed by: Nelva Nay L Total critical care time: 30 min Critical care time was exclusive of separately billable procedures and treating other patients. Critical care was necessary to treat or prevent imminent or life-threatening deterioration. Critical care was time spent personally by me on the following activities: development of treatment plan with patient and/or surrogate as well as nursing, discussions with consultants, evaluation of patient's response to treatment, examination of patient, obtaining history from patient or surrogate, ordering and performing treatments and interventions, ordering and review of laboratory studies, ordering and review of radiographic studies, pulse oximetry and re-evaluation of patient's condition.  Labs Review Labs Reviewed  COMPREHENSIVE METABOLIC PANEL - Abnormal; Notable for the following:    Sodium 133 (*)    Potassium 3.5 (*)    Glucose, Bld 116 (*)  Calcium 8.0 (*)    Albumin 2.3 (*)    AST 191 (*)    ALT 103 (*)    Alkaline Phosphatase 123 (*)    All other components within normal limits  CBC WITH DIFFERENTIAL - Abnormal; Notable for the following:    Platelets 137 (*)    Monocytes Relative 16 (*)    All other components within normal limits  I-STAT CG4 LACTIC ACID, ED    Imaging Review No  results found.    MDM   Final diagnoses:  Dehydration  Orthostatic hypotension  Rash  History of oral lesions  Other fatigue        Nelia Shiobert L Kunaal Walkins, MD 07/08/14 1148

## 2014-07-08 NOTE — Progress Notes (Signed)
OFFICE NOTE  Chief Complaint:  DOE, weight loss, rash  Primary Care Physician: Haze Rushing, MD  HPI:  Glenn Cooper a pleasant 58 year old male who appears quite ill. Per his wife he reported over the past 5 weeks worsening symptoms after he was working on a job in [place] where he was in a crawl space of the building. They're concerned about possible infection either virus or bacterial and has undergone multiple tests to try to determine the etiology of his symptoms. He developed a rash that generalized and is causing significant pruritus as well as desquamation of his hands. He has positional dizziness fatigue weakness and lethargy. He lost 25 pounds over the past 5 weeks. He is scheduled to see a rheumatologist today. Last week he was seen in emergency room and rehydrated do to hypotension. Today in the office he is markedly hypointense tentative with blood pressure 81/63. He did develop orthostatic hypotension with positional change in blood pressure dropped to 70/46. He was markedly dizzy and symptomatic with this. He is also described shortness of breath but no chest pain. He denies fevers but does have chills. Per his wife's report he had an elevated RF level and that was prompting him to see the rheumatologist.  PMHx:  Past Medical History  Diagnosis Date  . Kidney stone     Past Surgical History  Procedure Laterality Date  . Kindney stone surgery    . Cervical disc surgery      FAMHx:  Family History  Problem Relation Age of Onset  . Diabetes Other   . Cancer Father     lung    SOCHx:   reports that he has never smoked. He does not have any smokeless tobacco history on file. He reports that he does not drink alcohol or use illicit drugs.  ALLERGIES:  No Known Allergies  ROS: A comprehensive review of systems was negative except for: Constitutional: positive for chills, fatigue and weight loss Eyes: positive for visual disturbance Respiratory: positive  for dyspnea on exertion Integument/breast: positive for rash Hematologic/lymphatic: positive for easy bruising Allergic/Immunologic: positive for urticaria  HOME MEDS: Current Outpatient Prescriptions  Medication Sig Dispense Refill  . Alum & Mag Hydroxide-Simeth (MAGIC MOUTHWASH W/LIDOCAINE) SOLN Take 10 mLs by mouth 3 (three) times daily as needed for mouth pain.      . benzonatate (TESSALON) 200 MG capsule Take 200 mg by mouth 3 (three) times daily as needed for cough.      . desloratadine (CLARINEX) 5 MG tablet Take 5 mg by mouth daily as needed (allergies).      Marland Kitchen guaiFENesin (MUCINEX) 600 MG 12 hr tablet Take 600 mg by mouth 2 (two) times daily as needed for cough.      Marland Kitchen ibuprofen (ADVIL,MOTRIN) 800 MG tablet Take 1 tablet (800 mg total) by mouth 3 (three) times daily.  21 tablet  0  . silver sulfADIAZINE (SILVADENE) 1 % cream Apply 1 application topically 2 (two) times daily.       No current facility-administered medications for this visit.    LABS/IMAGING: No results found for this or any previous visit (from the past 48 hour(s)). No results found.  VITALS: BP 81/63  Pulse 96  Ht 6\' 1"  (1.854 m)  Wt 212 lb 1.6 oz (96.208 kg)  BMI 27.99 kg/m2  EXAM: General appearance: alert and ill appearing, fatigued Neck: no carotid bruit, no JVD and green oral thrush noted on tongue Lungs: rales RLL Heart: regular rate  and rhythm, S1, S2 normal, no murmur, click, rub or gallop Abdomen: soft, non-tender; bowel sounds normal; no masses,  no organomegaly Extremities: extremities normal, atraumatic, no cyanosis or edema Pulses: 2+ and symmetric Skin: Diffuse sandpaper, salmon colored rash over the trunk, desquamation of fingers, digital ulcers Neurologic: Mental status: Awake, but somewhat lethargic Psych: Mood appears flat  EKG: Small sinus rhythm at 96, nonspecific T wave changes  ASSESSMENT: 1. Symptomatic orthostatic hypotension 2. Diffuse papular rash which is pruritic,  with desquamation and digital ulcers 3. Oral ulcers and thrush 4. Unintentional weight loss  PLAN: 1.   Mr. Janee Mornhompson appears acutely ill today. His blood pressure is low and he is orthostatic. He he actually may be in distributive shock. He has an extensive rash without a clear diagnosis of an etiology. He was scheduled to see a rheumatologist today however I feel with his symptomatic hypotension that he needs to be hospitalized. I discussed referral to the emergency room for further evaluation and he may need an admission to internal medicine for further workup. From a cardiac standpoint, would be reasonable to get an echocardiogram to assess for pericardial effusion, but otherwise it did not feel that he is describing angina. Shortness of breath may be related to some pulmonary edema as he does have right lower lobe rales.  I'm happy to see him in followup after hospitalization if there is a cardiac need. Thanks for the kind referral.  Chrystie NoseKenneth C. Willella Harding, MD, [facility] Attending Cardiologist [facility]  Kazuto Sevey C 07/08/2014, 10:04 AM

## 2014-07-08 NOTE — H&P (Addendum)
Family Medicine Teaching Service Attending Note  I interviewed and examined patient Glenn Cooper and reviewed their tests and x-rays.  I discussed with Dr. Adriana Simasook and reviewed their note for today.  I agree with their assessment and plan.     Additionally  Was in normal state of health 6 weeks ago.  Then began to have rashes on extremities and then mouth then night sweats and fatigue.  Has had several courses of steroids that helped some but symptoms returned when stopped.  Has had dermatology (with bx) rheumatology, ID and hematology appointments and today was at cardiolologist office.  Was to follow up with rheumatologist but found to be hypoten and referred for admission.  Has been feeling lightheadness with standing for days to weeks without loss of consciousness .  Symptoms seems consistent with an immunologic cause possibly CV.  Do not think could be chronic infection SBE but if becomes febrile would check BCs.   Hypotension may be from mild dehydration due to mouth lesions and dilated vascular from inflamation.  Would check echo to rule out valvular lesions.  Also other labs as ordered.  If can maintain his blood pressure and no alarming findings from lab work up may continue further work up with his rheumatologist Dr Dierdre ForthBeekman.  Would contact him in AM

## 2014-07-09 DIAGNOSIS — E43 Unspecified severe protein-calorie malnutrition: Secondary | ICD-10-CM | POA: Insufficient documentation

## 2014-07-09 DIAGNOSIS — R0600 Dyspnea, unspecified: Secondary | ICD-10-CM

## 2014-07-09 DIAGNOSIS — E860 Dehydration: Principal | ICD-10-CM

## 2014-07-09 LAB — RPR

## 2014-07-09 LAB — PROCALCITONIN: Procalcitonin: <0.1 ng/mL

## 2014-07-09 LAB — ANA: ANA: POSITIVE — AB

## 2014-07-09 LAB — PATHOLOGIST SMEAR REVIEW

## 2014-07-09 LAB — ANTI-NUCLEAR AB-TITER (ANA TITER): ANA Titer 1: NEGATIVE

## 2014-07-09 LAB — GLUCOSE, CAPILLARY: Glucose-Capillary: 133 mg/dL — ABNORMAL HIGH (ref 70–99)

## 2014-07-09 LAB — CORTISOL-AM, BLOOD: CORTISOL - AM: 12.4 ug/dL (ref 4.3–22.4)

## 2014-07-09 MED ORDER — INSULIN ASPART 100 UNIT/ML ~~LOC~~ SOLN
0.0000 [IU] | Freq: Three times a day (TID) | SUBCUTANEOUS | Status: DC
  Administered 2014-07-10: 2 [IU] via SUBCUTANEOUS
  Administered 2014-07-10: 1 [IU] via SUBCUTANEOUS
  Administered 2014-07-10: 2 [IU] via SUBCUTANEOUS
  Administered 2014-07-11: 1 [IU] via SUBCUTANEOUS

## 2014-07-09 MED ORDER — SODIUM CHLORIDE 0.9 % IV SOLN
INTRAVENOUS | Status: DC
  Administered 2014-07-09: 18:00:00 via INTRAVENOUS

## 2014-07-09 MED ORDER — ENSURE COMPLETE PO LIQD
237.0000 mL | Freq: Every day | ORAL | Status: DC
  Administered 2014-07-09 – 2014-07-11 (×8): 237 mL via ORAL

## 2014-07-09 MED ORDER — METHYLPREDNISOLONE SODIUM SUCC 40 MG IJ SOLR
40.0000 mg | Freq: Two times a day (BID) | INTRAMUSCULAR | Status: DC
  Administered 2014-07-09 – 2014-07-10 (×2): 40 mg via INTRAVENOUS
  Filled 2014-07-09 (×4): qty 1

## 2014-07-09 MED ORDER — ALPRAZOLAM 0.5 MG PO TABS: 0.5000 mg | ORAL_TABLET | Freq: Once | ORAL | Status: DC

## 2014-07-09 MED ORDER — DOXYCYCLINE HYCLATE 100 MG IV SOLR
200.0000 mg | Freq: Two times a day (BID) | INTRAVENOUS | Status: DC
  Administered 2014-07-09 – 2014-07-10 (×2): 200 mg via INTRAVENOUS
  Filled 2014-07-09 (×3): qty 200

## 2014-07-09 NOTE — Progress Notes (Signed)
Pt is very claustrophobic and very anxious about completing a CT.  RN contacted radiology to talk about testing machine and time of test, pt refused as he did not want to wear mask and be in machine. RN spoke with pt family and pt about ordering Xanax to help calm him and we try the test in the morning.  RN let pt know Xanax was ordered and he can go down for CT in the morning and he said "let see how the Xray goes and I don't want the Xanax it dose not work". RN will continue to monitor. Pt resting in bed with call light in reach.    Thane EduKimberly Hecker, RN

## 2014-07-09 NOTE — Progress Notes (Signed)
Family Medicine Teaching Service Daily Progress Note Intern Pager: [phone]  Patient name: Glenn Cooper Medical record number: [id number] Date of birth: [birth year] Age: 58 y.o. Gender: male  Primary Care Provider: Reita Cliche, MD Consultants: none Code Status: full  Pt Overview and Major Events to Date:  10/6: patient admitted for generalized weakness, fatigue, rash, and hypotension.  Assessment and Plan: Glenn Cooper is a 58 y.o. male presenting with hypotension. PMH is significant for Cervical disc surgery, Kidney stone, and Melanoma (rt temple--removed)  Hypotension - Unclear etiology in the setting of systemic illness. No fever, tachycardia or elevated white count to suggest sepsis. Lactic acid negative. Could be secondary to volume overload given lung exam, although patient appears to be clinically euvolemic. DDx is very broad and includes drug/toxin exposure, adrenal insuffiencey, hypovolemia, cardiogenic in nature.   - Hypotension currently improved following fluids.  - Holding additional IV fluids at this time secondary to abnormal lung exam.   - Will continue to monitor closely.   SOB - desaturations to 86% on day of admission.  - supplemental O2 - begin empiric Azithromycin tx  Rash with mucosal involvement; Acute systemic illness of unknown etiology  - Unclear etiology; Awaiting skin biopsy results  - Viscous lidocaine to help with mouth ulcerations and to aid in PO intake.  - HIV - neg; BNP - 22.7; TSH - wnl; ESR - 30; CRP - <0.5; ANA - positive; AM Cortisol - 12.4; ANA titer - neg; A1c - 6.5  - will be calling Rheumatologist pt was suppose to see yesterday for appt.  Elevated LFT's & Alk phos  - Recent CT abdomen negative  - Acute hepatitis panel - neg  Fatigue and weight loss - secondary to acute/systemic illness.  - Nutrition consult  - Heart healthy diet   FEN/GI: heart healthy diet; 1/2 NS '@100ml' /hr PPx: SQ hep  Disposition: home w/ family once deemed  medically stable.  Subjective:  Patient states he feels well. Still c/o generalized weakness and some lightheadedness when he sits up. Sores in his mouth are still quite tender. No other issues. He says he doesn't feel SOB but he feels better w/ the supplemental O2 on. No further complaints. Family was present and asked to speak w/ me. We had a long discussion about the patient and I explained we would try to obtain all the info gathered from these outside sources he has seen. They seemed pleased after our discussion.   Objective: Temp:  [98.1 F (36.7 C)-98.6 F (37 C)] 98.2 F (36.8 C) (10/07 1324) Pulse Rate:  [96-99] 97 (10/07 1326) Resp:  [19-20] 19 (10/07 1324) BP: (99-114)/(61-64) 99/64 mmHg (10/07 1326) SpO2:  [86 %-93 %] 93 % (10/07 1324) Weight:  [212 lb 8 oz (96.389 kg)] 212 lb 8 oz (96.389 kg) (10/06 2300) Physical Exam:  General: pale appearing gentleman resting in bed,  O2 in place; NAD.  HEENT: NCAT. MMM. Ulcerations noted on both sides of the tongue. Additional ulcerations noted in mouth by pt but unable to be seen by examiner. Oropharynx clear. No scleral icterus noted.  Cardiovascular: RRR. No m/r/g.  Respiratory: Coarse bibasilar crackles in lower lobes. No wheezing noted.  Abdomen: soft, nontender, nondistended. No palpable organomegaly.  Extremities: Trace LE edema.  Skin: diffuse erythematous papular rash noted - predominantly on the trunk and arms. Desquamation noted around the finger tips. Few scattered lesions noted on the palmar aspect of the hands and fingers at DIP and PIP. Some lesions appeared blister-like, others had  ecchymotic qualities to them. Neuro: No focal deficits. AO x 3.   Laboratory:  Recent Labs Lab 07/04/14 1348 07/08/14 1035 07/08/14 1522  WBC 6.1 6.7 5.1  HGB 14.2 14.3 13.0  HCT 39.9 40.8 37.8*  PLT 152 137* 120*    Recent Labs Lab 07/04/14 1348 07/08/14 1035 07/08/14 1522  NA 135* 133*  --   K 3.9 3.5*  --   CL 99 98  --    CO2 26 23  --   BUN 13 12  --   CREATININE 0.83 0.87 0.73  CALCIUM 8.4 8.0*  --   PROT 6.4 6.2  --   BILITOT 0.9 1.0  --   ALKPHOS 129* 123*  --   ALT 151* 103*  --   AST 265* 191*  --   GLUCOSE 116* 116*  --    Hep panel - neg HIV - neg BNP - 22.7 TSH - wnl ESR - 30 CRP - <0.5 ANA - positive AM Cortisol - 12.4 ANA titer - neg A1c - 6.5   Imaging/Diagnostic Tests:  CXR 10/6 IMPRESSION:  Progression of bilateral airspace disease which may represent  pneumonia or edema.   Elberta Leatherwood, MD 07/09/2014, 3:31 PM PGY-1, [place] Intern pager: [phone], text pages welcome

## 2014-07-09 NOTE — Progress Notes (Addendum)
INITIAL NUTRITION ASSESSMENT  DOCUMENTATION CODES Per approved criteria  -Severe malnutrition in the context of acute illness or injury   INTERVENTION: Ensure Complete po 5 times daily, each supplement provides 350 kcal and 13 grams of protein RD to follow for nutrition care plan  NUTRITION DIAGNOSIS: Inadequate oral intake related to poor appetite & odynophagia as evidenced by patient report  Goal: Pt to meet >/= 90% of their estimated nutrition needs   Monitor:  PO & supplemental intake, weight, labs, I/O's  Reason for Assessment: Consult, Malnutrition Screening Tool Report  58 y.o. male  Admitting Dx: Weakness, fatigue, rash  ASSESSMENT: 58 y.o. male with a PMH of kidney stone presents from the Cardiology office with hypotension.  Patient reports a decreased appetite & odynophagia due to mouth sores for the past 4-5 weeks; his solid food intake has been very limited and has been mostly consuming Ensure supplements during this time; also endorses a 25 lb weight loss (severe for time frame); PO intake very poor at 10% per flowsheet records; amenable to continuing with Ensure Complete; RD recommended up to 5 times per day to help meet nutrition needs -- pt amenable.  Nutrition focused physical exam completed.  No muscle or subcutaneous fat depletion noticed.  Patient meets criteria for severe malnutrition in the context of acute illness or injury as evidenced by < 50% intake of estimated energy requirement for > 5 days and 10% weight loss x 1 month.  Height: Ht Readings from Last 1 Encounters:  07/08/14 6\' 1"  (1.854 m)    Weight: Wt Readings from Last 1 Encounters:  07/08/14 212 lb 8 oz (96.389 kg)    Ideal Body Weight: 184 lb  % Ideal Body Weight: 115%  Wt Readings from Last 10 Encounters:  07/08/14 212 lb 8 oz (96.389 kg)  07/08/14 212 lb 1.6 oz (96.208 kg)  07/04/14 214 lb (97.07 kg)    Usual Body Weight: 237 lb  % Usual Body Weight: 90%  BMI:  Body mass  index is 28.04 kg/(m^2).  Estimated Nutritional Needs: Kcal: 2200-2400 Protein: 110-120 gm Fluid: 2.2-2.4 L  Skin: Intact  Diet Order: Cardiac  EDUCATION NEEDS: -No education needs identified at this time   Intake/Output Summary (Last 24 hours) at 07/09/14 1515 Last data filed at 07/09/14 0836  Gross per 24 hour  Intake    830 ml  Output    800 ml  Net     30 ml     Labs:   Recent Labs Lab 07/04/14 1348 07/08/14 1035 07/08/14 1522  NA 135* 133*  --   K 3.9 3.5*  --   CL 99 98  --   CO2 26 23  --   BUN 13 12  --   CREATININE 0.83 0.87 0.73  CALCIUM 8.4 8.0*  --   GLUCOSE 116* 116*  --     Scheduled Meds: . azithromycin  250 mg Oral Daily  . heparin  5,000 Units Subcutaneous 3 times per day  . potassium chloride  40 mEq Oral BID    Continuous Infusions: . sodium chloride 100 mL/hr at 07/09/14 [id number]    Past Medical History  Diagnosis Date  . Kidney stone   . Gout   . Oral thrush     "last 5 weeks" (07/08/2014)    Past Surgical History  Procedure Laterality Date  . Kidney stone surgery  1990's or before    "cut my side"  . Anterior cervical decomp/discectomy fusion  1990's?  .Marland Kitchen  Back surgery      Maureen Chatters, RD, LDN Pager #: [phone] After-Hours Pager #: [phone]

## 2014-07-09 NOTE — Progress Notes (Signed)
Family Medicine Teaching Service Attending Note  I interviewed and examined patient Glenn Cooper and reviewed their tests and x-rays.  I discussed with Dr. Wende MottMcKeag and reviewed their note for today.  I agree with their assessment and plan.     Additionally  Still feeling weak not walking around room just standing at bedside Labs noted Contact Rheumatology who knows patient Would strongly consider pulmonary consult given worsening infiltrates

## 2014-07-09 NOTE — Consult Note (Addendum)
Name: Glenn Cooper MRN: [id number] DOB: May 11, 1956    ADMISSION DATE:  07/08/2014 CONSULTATION DATE:  07/09/14  REFERRING MD :  FPTS  CHIEF COMPLAINT:  SOB  BRIEF PATIENT DESCRIPTION: 58yo male with minimal PMH presented 10/6 from cardiology office with hypotension, fever, weakness, rash.  Complicated case, PCCM consulted for SOB.   SIGNIFICANT EVENTS    STUDIES:  CTA chest 06/10/14>>neg PE 2D echo 10/6>>>EF 65%, mild/mod LA dilation, no pericardial effusion   Autoimmune/ immunology 10/6 HIV >>>NR 10/6 hepatitis panel>>neg  10/6 ANA >>POS, titer neg 10/6 CRP>>  <0.5 10/6 ESR =30 (wife reports neg Lyme, EBV and RMSF as outpt)  SKIN BX outpt>>>  HISTORY OF PRESENT ILLNESS:  58yo male with minimal PMH presented 10/6 from cardiology office with hypotension, fever, weakness, rash.  Was in his usual state of health until approx 5 weeks ago when he developed diffuse, papular, pruritic rash with severe mouth ulcers, severe fatigue, 25lbs weight loss (he associates with severe mouth ulcers and poor po intake) and mild SOB.  Dermatology dx coxsackie and did skin bx (?results) and he was ultimately referred to rheumatology.  Prior to rheum appointment, however, he developed symptomatic orthostatic hypotension and during outpt cards w/u had BP 70's and was sent to ER.  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  Denies chest pain, purulent sputum, hemoptysis, recent sick exposures, new medications or vaccines.  Only recent travel has been limited to [place], [place], [place], [place]. Is c/o mild SOB but much improved and essentially the same as it has been x 5 weeks.    PAST MEDICAL HISTORY : Past Medical History  Diagnosis Date  . Kidney stone   . Gout   . Oral thrush     "last 5 weeks" (07/08/2014)     has past surgical history that includes Kidney stone surgery (1990's or before); Anterior cervical decomp/discectomy fusion (1990's?); and Back  surgery. Prior to Admission medications   Medication Sig Start Date End Date Taking? Authorizing Provider  Alum & Mag Hydroxide-Simeth (MAGIC MOUTHWASH W/LIDOCAINE) SOLN Take 10 mLs by mouth 3 (three) times daily as needed for mouth pain.   Yes Historical Provider, MD  ibuprofen (ADVIL,MOTRIN) 800 MG tablet Take 1 tablet (800 mg total) by mouth 3 (three) times daily. 06/10/14  Yes April K Palumbo-Rasch, MD  silver sulfADIAZINE (SILVADENE) 1 % cream Apply 1 application topically 2 (two) times daily.   Yes Historical Provider, MD   No Known Allergies  FAMILY HISTORY:  family history includes Cancer in his father; Diabetes in his other. SOCIAL HISTORY:  reports that he has never smoked. He has never used smokeless tobacco. He reports that he does not drink alcohol or use illicit drugs.  REVIEW OF SYSTEMS:   As per HPI - All other systems reviewed and were neg.   SUBJECTIVE:   VITAL SIGNS: Temp:  [98.1 F (36.7 C)-98.6 F (37 C)] 98.2 F (36.8 C) (10/07 1324) Pulse Rate:  [96-99] 97 (10/07 1326) Resp:  [19-20] 19 (10/07 1324) BP: (99-114)/(61-64) 99/64 mmHg (10/07 1326) SpO2:  [86 %-93 %] 93 % (10/07 1324) Weight:  [212 lb 8 oz (96.389 kg)] 212 lb 8 oz (96.389 kg) (10/06 2300)  PHYSICAL EXAMINATION: General:  Pleasant male, NAD  Neuro:  Awake, alert, appropriate, MAe  HEENT:  Mm dry, diffuse mouth ulcers, no JVD Cardiovascular:  s1s2 rrr, no rub Lungs:  No distress, coarse crackles mild scattered inspiration left greater rt,  no rub Abdomen:  Soft, nt, +bs Musculoskeletal:  Warm and dry, RUE swelling , no joint swelling Skin:  Diffuse, papular rash with BUE digital ulcerations    Recent Labs Lab 07/04/14 1348 07/08/14 1035 07/08/14 1522  NA 135* 133*  --   K 3.9 3.5*  --   CL 99 98  --   CO2 26 23  --   BUN 13 12  --   CREATININE 0.83 0.87 0.73  GLUCOSE 116* 116*  --     Recent Labs Lab 07/04/14 1348 07/08/14 1035 07/08/14 1522  HGB 14.2 14.3 13.0  HCT 39.9 40.8  37.8*  WBC 6.1 6.7 5.1  PLT 152 137* 120*   Dg Chest 2 View  07/08/2014   CLINICAL DATA:  Hypotension  EXAM: CHEST  2 VIEW  COMPARISON:  06/09/2014  FINDINGS: Ill-defined bilateral airspace disease has progressed in the interval and may represent pneumonia or edema. Negative for pleural effusion. Heart size not enlarged.  IMPRESSION: Progression of bilateral airspace disease which may represent pneumonia or edema.   Electronically Signed   By: Franchot Gallo M.D.   On: 07/08/2014 16:32    ASSESSMENT / PLAN:  SOB - reported desat 10/6 to 86% on RA.  Currently 100% on 2L R/o developing ALI from diffuse inflammatory process, infectious favor over vasculitis REC -  F/u CXR  Supplemental O2 as needed  pulm hygiene  Empiric abx per FPTS, noted, changed to doxy for now empiric steroids for now, low dose CT chest re assessment, last done 1 month ago, but has NEW findings now, no contrast needed Monitor for hemoptysis Send viral panel and assess rsv from nares  Diffuse rash with mucosal involvement.  Unclear etiology.  Has been ongoing x 5 weeks! Fever - resolved.  Lactate neg.  No leukocytosis.  ANA POS  REC -  FPTS trying to obtain skin bx results and have rheum to see  ?repeat lyme, RMSF  Consider ID involvement in am (saw Dr hatcher as outpt), and have repeat of AB IGM ebv, cmv, coxsackie etc Cont viscous lidocaine  Blood cultures if spikes fever   Hypotension - improved.  Suspect r/t poor po intake in setting severe mouth ulcers/ pain. Lactate neg.  No further fevers, not tachy.   REC-  Cont gentle fluids  Change fluids to normal saline with prior Na low  Nickolas Madrid, NP 07/09/2014  4:49 PM Pager: [phone] or [phone]  *Care during the described time interval was provided by me and/or other providers on the critical care team. I have reviewed this patient's available data, including medical history, events of note, physical examination and test results as part of  my evaluation.  I have fully examined this patient and agree with above findings.    And edited in full  Lavon Paganini. Titus Mould, MD, [place] Pgr: [facility]

## 2014-07-09 NOTE — Progress Notes (Signed)
Pt influenza swab sent along with RSV as ordered but lab calls RN to inform her RSV swab needs recollection; Lab was called for clarification on sample collection,lab informed RN RSV is collected on children and not adult so RN should call MD for clarification; MD paged and MD returns call back to be informed of the RSV collection clarification; MD said RSV order was not stated in resident note therefore; it should be hold off and will be recollect tomorrow if needed after clarification. Incoming RN informed and reported off. Arabella MerlesP. Amo Davielle Lingelbach RN.

## 2014-07-10 ENCOUNTER — Inpatient Hospital Stay (HOSPITAL_COMMUNITY): Payer: BC Managed Care – PPO

## 2014-07-10 DIAGNOSIS — J189 Pneumonia, unspecified organism: Secondary | ICD-10-CM

## 2014-07-10 LAB — GLUCOSE, CAPILLARY
GLUCOSE-CAPILLARY: 175 mg/dL — AB (ref 70–99)
GLUCOSE-CAPILLARY: 148 mg/dL — AB (ref 70–99)
Glucose-Capillary: 183 mg/dL — ABNORMAL HIGH (ref 70–99)

## 2014-07-10 LAB — ALDOLASE: Aldolase: 10.7 U/L — ABNORMAL HIGH (ref <=8.1)

## 2014-07-10 LAB — ANTI-SMITH ANTIBODY: ENA SM AB SER-ACNC: NEGATIVE

## 2014-07-10 LAB — ANTI-DNA ANTIBODY, DOUBLE-STRANDED: DS DNA AB: 1 [IU]/mL

## 2014-07-10 LAB — ANTI-RIBONUCLEIC ACID ANTIBODY: Sm/rnp: <1

## 2014-07-10 LAB — SJOGRENS SYNDROME-A EXTRACTABLE NUCLEAR ANTIBODY: SSA (RO) (ENA) ANTIBODY, IGG: NEGATIVE

## 2014-07-10 LAB — SJOGRENS SYNDROME-B EXTRACTABLE NUCLEAR ANTIBODY: SSB (La) (ENA) Antibody, IgG: <1

## 2014-07-10 MED ORDER — PREDNISONE 20 MG PO TABS
40.0000 mg | ORAL_TABLET | Freq: Every day | ORAL | Status: DC
  Administered 2014-07-11: 40 mg via ORAL
  Filled 2014-07-10 (×2): qty 2

## 2014-07-10 MED ORDER — DEXTROSE 5 % IV SOLN
100.0000 mg | Freq: Two times a day (BID) | INTRAVENOUS | Status: DC
  Administered 2014-07-10 – 2014-07-11 (×2): 100 mg via INTRAVENOUS
  Filled 2014-07-10 (×3): qty 100

## 2014-07-10 NOTE — Care Management Note (Unsigned)
    Page 1 of 1   07/10/2014     3:35:46 PM CARE MANAGEMENT NOTE 07/10/2014  Patient:  Glenn DeedsHOMPSON,Jadarrius   Account Number:  [id number]  Date Initiated:  07/10/2014  Documentation initiated by:  Abigaile Rossie  Subjective/Objective Assessment:   Pt adm on 07/08/14 with rash, PNA, hypotension.  PTA, pt resides at home with spouse and is independent.     Action/Plan:   Will follow for dc needs as pt progresses.   Anticipated DC Date:  07/13/2014   Anticipated DC Plan:  HOME/SELF CARE      DC Planning Services  CM consult      Choice offered to / List presented to:             Status of service:  In process, will continue to follow Medicare Important Message given?   (If response is "NO", the following Medicare IM given date fields will be blank) Date Medicare IM given:   Medicare IM given by:   Date Additional Medicare IM given:   Additional Medicare IM given by:    Discharge Disposition:    Per UR Regulation:  Reviewed for med. necessity/level of care/duration of stay  If discussed at Long Length of Stay Meetings, dates discussed:    Comments:

## 2014-07-10 NOTE — Progress Notes (Signed)
   Name: Glenn Cooper MRN: [id number] DOB: Apr 24, 1956    ADMISSION DATE:  07/08/2014 CONSULTATION DATE:  07/09/14  REFERRING MD :  FPTS  CHIEF COMPLAINT:  SOB  BRIEF PATIENT DESCRIPTION: 58 y/o male with minimal PMH presented 10/6 from cardiology office with hypotension, fever, weakness, rash & SOB.  PCCM consulted for SOB.   SIGNIFICANT EVENTS    STUDIES:  06/10/14  CTA chest > neg PE 10/6 2D echo > EF 65%, mild/mod LA dilation, no pericardial effusion   Autoimmune/ immunology 10/6 HIV >>>NR 10/6 hepatitis panel>>neg  10/6 ANA >>POS, titer neg 10/6 CRP>>  <0.5 10/6 ESR 30 (wife reports neg Lyme, EBV and RMSF as outpt)  SKIN BX outpt>>>   SUBJECTIVE:  Pt denies current complaints, anxiously waiting for lab results.  Pt unable to complete ct chest due to anxiety  VITAL SIGNS: Temp:  [97.5 F (36.4 C)-98.2 F (36.8 C)] 97.5 F (36.4 C) (10/08 0625) Pulse Rate:  [85-97] 85 (10/08 0625) Resp:  [18-19] 19 (10/08 0625) BP: (99-111)/(64-66) 109/66 mmHg (10/08 0625) SpO2:  [93 %] 93 % (10/08 0625)  PHYSICAL EXAMINATION: General:  Pleasant male, NAD  Neuro:  Awake, alert, appropriate, MAE HEENT:  Mm dry, diffuse mouth ulcers, no JVD Cardiovascular:  s1s2 rrr, no rub Lungs:  No distress, diffuse bilat crackles Abdomen:  Soft, nt, +bs Musculoskeletal:  Warm and dry, RUE swelling , no joint swelling Skin:  Diffuse, papular rash with BUE digital ulcerations, pale digits    I have reviewed all of today's lab results. 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 Please call if we can be of further assistance  Merton Border, MD ; [facility] [phone].  After 5:30 PM or weekends, call [phone]

## 2014-07-10 NOTE — Progress Notes (Signed)
Family Medicine Teaching Service Attending Note  I interviewed and examined patient Glenn Cooper and reviewed their tests and x-rays.  I discussed with Dr. Wende MottMcKeag and reviewed their note for today.  I agree with their assessment and plan.     Additionally  Feeling much better - up exercising at 1 AM.  Does feel anxious and wants to refuse CT Suspect mental effects of high dose steroids I think best course is to see if he can maintain his blood pressure off ivf and if so to allow him to return to his outpatient rheumatology physician who is very involved and will provide close continuity Change to oral medications and SL ivf Monitor blood pressure and ambulation

## 2014-07-10 NOTE — Progress Notes (Signed)
Family Medicine Teaching Service Daily Progress Note Intern Pager: [phone]  Patient name: Glenn Cooper Medical record number: [id number] Date of birth: 1955-11-10 Age: 58 y.o. Gender: male  Primary Care Provider: Reita Cliche, MD Consultants: none Code Status: full  Pt Overview and Major Events to Date:  10/6: patient admitted for generalized weakness, fatigue, rash, and hypotension.  Assessment and Plan: Glenn Cooper is a 58 y.o. male presenting with hypotension. PMH is significant for Cervical disc surgery, Kidney stone, and Melanoma (rt temple--removed)  Hypotension - Unclear etiology in the setting of systemic illness. No fever, tachycardia or elevated white count to suggest sepsis. Lactic acid negative. Could be secondary to volume overload given lung exam, although patient appears to be clinically euvolemic. DDx is very broad and includes drug/toxin exposure, adrenal insuffiencey, hypovolemia, cardiogenic in nature.   - Hypotension currently improved following fluids.  - Holding additional IV fluids at this time secondary to abnormal lung exam.   - Will continue to monitor closely.   SOB - desaturations to 86% on day of admission.  - supplemental O2; DCd since, tolerating well. - Azithromycin >> changed to Doxycycline $RemoveBefore'200mg'[id number]$  BID (per pulmonology) - solumedrol $RemoveBefo'40mg'nLlgtDovRjd$  per pulm >> changed to prednisone $RemoveBefor'40mg'UCcOWaEfGejx$   - Rheum had asked that we obtain skin bx prior to steroid therapy, I did not communicate this w/  Pulm. I apologize for this lack of communication.  Rash with mucosal involvement; Acute systemic illness of unknown etiology  - Unclear etiology; Awaiting skin biopsy results  - Viscous lidocaine to help with mouth ulcerations and to aid in PO intake.  - HIV - neg; BNP - 22.7; TSH - wnl; ESR - 30; CRP - <0.5; ANA - positive; AM Cortisol - 12.4; ANA titer - neg; A1c - 6.5  - Patient's Rheumatologist contacted yesterday, recommended lab tests: RPR (neg), Procalcitonin (neg), SSA/SSB  (pending), anit-RNA (pending), anti-dsDNA (pending), anti-Smith (pending), respiratory virus panel (pending).  - will call his rheumatologist again today w/ test results and to set up f/u appointment.   Elevated LFT's & Alk phos  - Recent CT abdomen negative  - Acute hepatitis panel - neg  Fatigue and weight loss - secondary to acute/systemic illness.  - Nutrition consult  - Heart healthy diet   FEN/GI: heart healthy diet; DC IVF PPx: SQ hep  Disposition: home w/ family once deemed medically stable.  Subjective:  Pt feels significantly better this AM. States he feels like he has more energy. Did a "small workout" this AM around 1am. Self DC'd his nasal cannula. Satting well. Eating and drinking more today.   Objective: Temp:  [97.5 F (36.4 C)-98.2 F (36.8 C)] 97.5 F (36.4 C) (10/08 0625) Pulse Rate:  [85-97] 85 (10/08 0625) Resp:  [18-19] 19 (10/08 0625) BP: (99-111)/(64-66) 109/66 mmHg (10/08 0625) SpO2:  [93 %] 93 % (10/08 0625) Physical Exam:  General: pale appearing gentleman resting in bed, [place] O2 in place; NAD.  HEENT: NCAT. MMM. Ulcerations noted on both sides of the tongue. Additional ulcerations noted in mouth by pt but unable to be seen by examiner. Oropharynx clear. No scleral icterus noted.  Cardiovascular: RRR. No m/r/g.  Respiratory: Coarse bibasilar crackles in lower lobes. No wheezing noted.  Abdomen: soft, nontender, nondistended. No palpable organomegaly.  Extremities: Trace LE edema.  Skin: diffuse erythematous papular rash noted - predominantly on the trunk and arms. Desquamation noted around the finger tips. Few scattered lesions noted on the palmar aspect of the hands and fingers at DIP and PIP.  Some lesions appeared blister-like, others had ecchymotic qualities to them. Neuro: No focal deficits. AO x 3.   Laboratory:  Recent Labs Lab 07/04/14 1348 07/08/14 1035 07/08/14 1522  WBC 6.1 6.7 5.1  HGB 14.2 14.3 13.0  HCT 39.9 40.8 37.8*  PLT 152  137* 120*    Recent Labs Lab 07/04/14 1348 07/08/14 1035 07/08/14 1522  NA 135* 133*  --   K 3.9 3.5*  --   CL 99 98  --   CO2 26 23  --   BUN 13 12  --   CREATININE 0.83 0.87 0.73  CALCIUM 8.4 8.0*  --   PROT 6.4 6.2  --   BILITOT 0.9 1.0  --   ALKPHOS 129* 123*  --   ALT 151* 103*  --   AST 265* 191*  --   GLUCOSE 116* 116*  --    Hep panel - neg HIV - neg BNP - 22.7 TSH - wnl ESR - 30 CRP - <0.5 ANA - positive AM Cortisol - 12.4 ANA titer - neg A1c - 6.5   Imaging/Diagnostic Tests:  CXR 10/6 IMPRESSION:  Progression of bilateral airspace disease which may represent  pneumonia or edema.   Glenn Leatherwood, MD 07/10/2014, 12:14 PM PGY-1, [place] Intern pager: [phone], text pages welcome

## 2014-07-11 ENCOUNTER — Inpatient Hospital Stay (HOSPITAL_COMMUNITY): Payer: BC Managed Care – PPO

## 2014-07-11 LAB — BASIC METABOLIC PANEL
ANION GAP: 12 (ref 5–15)
BUN: 10 mg/dL (ref 6–23)
CHLORIDE: 97 meq/L (ref 96–112)
CO2: 24 meq/L (ref 19–32)
CREATININE: 0.71 mg/dL (ref 0.50–1.35)
Calcium: 8.5 mg/dL (ref 8.4–10.5)
GFR calc non Af Amer: >90 mL/min (ref >90)
Glucose, Bld: 102 mg/dL — ABNORMAL HIGH (ref 70–99)
POTASSIUM: 4.8 meq/L (ref 3.7–5.3)
Sodium: 133 mEq/L — ABNORMAL LOW (ref 137–147)

## 2014-07-11 LAB — PROCALCITONIN

## 2014-07-11 LAB — GLUCOSE, CAPILLARY: Glucose-Capillary: 129 mg/dL — ABNORMAL HIGH (ref 70–99)

## 2014-07-11 MED ORDER — DOXYCYCLINE HYCLATE 100 MG PO TABS: 100.0000 mg | ORAL_TABLET | Freq: Two times a day (BID) | ORAL | Status: AC

## 2014-07-11 MED ORDER — DOXYCYCLINE HYCLATE 100 MG PO TABS
100.0000 mg | ORAL_TABLET | Freq: Two times a day (BID) | ORAL | Status: DC
  Filled 2014-07-11: qty 1

## 2014-07-11 MED ORDER — PREDNISONE 20 MG PO TABS: 40.0000 mg | ORAL_TABLET | Freq: Every day | ORAL | Status: AC

## 2014-07-11 NOTE — Discharge Instructions (Signed)

## 2014-07-12 NOTE — Discharge Summary (Signed)
[facility] Discharge Summary  Patient name: Glenn Cooper Medical record number: [id number] Date of birth: [birth year] Age: 58 y.o. 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  HEENT: NCAT. MMM. Healing ulcerations noted bilaterally on the tongue. Additional ulcerations noted on buccal mucosa. Oropharynx clear. No scleral icterus noted.  Cardiovascular: RRR. No m/r/g.  Respiratory: Coarse bibasilar crackles in lower lobes. No wheezing noted.  Abdomen: soft, nontender, nondistended. No palpable organomegaly.  Extremities: Trace LE edema.  Skin: diffuse erythematous papular rash noted - predominantly on the trunk and arms. Desquamation noted around the finger tips. Few scattered lesions noted on the palmar aspect of the hands and fingers at DIP and PIP. Some lesions appeared blister-like, others had ecchymotic qualities to them.  Neuro: No focal deficits. AO x 3.   Brief Hospital Course:  Patient presented with a history of feeling ill for approximately 5 weeks. He reports that approximately 5 weeks he developed a diffuse rash along his torso and upper extremities along with mouth/tongue ulcers and severe fatigue. Since the beginning of these symptoms he is seen a number of specialists as well as his primary care physician. On admission he was also endorsing some shortness of breath specifically with exertion. Denied  chest pain. Denied abdominal pain nausea or vomiting. Do to his mouth/tongue ulcerations he had significantly poor oral intake and had a reported 25 pound weight loss during this 5 week span. Patient was noted to have symptomatic hypotension on presentation. He was also noted that he had desaturations down to 85%.  Patient was placed on supplemental oxygen and IV fluids. A multitude of tests were obtained on admission and later at the request of patient's rheumatologist. Of note ESR was found to be elevated at 30, ANA was positive however ANA titer was negative, and aldolase was found to be 10.7. Patient was placed on empiric treatment for community acquired pneumonia with azithromycin.  Pulmonology was consulted due to his desaturations as well as the findings found on physical exam. Per their requests azithromycin was changed to doxycycline. He is also placed on empiric steroids.   Significant communication was kept between inpatient care and patient's rheumatologist throughout his stay.  Patient responded very well to the steroids. Rash and fatigue diminished significantly. Patient's breathing also improved. The following day patient was placed on oral steroids due to a increase by mouth tolerance. Patient noted that mouth/tongue ulcerations had diminished significantly. These treatments were continued throughout his stay and patient was discharged after significant improvements with oral intake and asymptomatic orthostatic vital signs. Patient was discharged with oral prednisone prescription which will last until his followup appointment with rheumatology. As well as a prescription to complete his course of doxycycline.  Issues for Follow Up:  - Skin bx taken by Melvin prior to admission; may want to re-bx lesions on hand. No bx taken while admitted.  Significant Procedures: none  Significant Labs and Imaging:   Recent Labs Lab 07/08/14 1035 07/08/14 1522  WBC 6.7 5.1  HGB 14.3  13.0  HCT 40.8 37.8*  PLT  137* 120*    Recent Labs Lab 07/08/14 1035 07/08/14 1522 07/11/14 0423  NA 133*  --  133*  K 3.5*  --  4.8  CL 98  --  97  CO2 23  --  24  GLUCOSE 116*  --  102*  BUN 12  --  10  CREATININE 0.87 0.73 0.71  CALCIUM 8.0*  --  8.5  ALKPHOS 123*  --   --   AST 191*  --   --   ALT 103*  --   --   ALBUMIN 2.3*  --   --    HIV - neg Hep panel - neg BNP - 22.7 TSH - 2.25 ESR - 30 CRP - <0.5 ANA - positive ANA titer - neg AM Cortisol - 12.4 A1c - 6.5  RPR (neg) Procalcitonin (neg) SSA/SSB <1.0 (both) anit-RNA <1.0 anti-dsDNA - 1 anti-Smith <1.0 Aldolase 10.7  CXR 10/6 IMPRESSION:  Progression of bilateral airspace disease which may represent  pneumonia or edema.  CXR 10/8 IMPRESSION:  Stable atelectasis or scarring in left midlung peripheral. Again  noted bilateral basilar atelectasis or infiltrate left greater than  right.  CXR 10/9 IMPRESSION:  Stable interstitial and nodular densities are noted throughout both  lungs concerning for pulmonary edema or atypical inflammation.   Results/Tests Pending at Time of Discharge: none  Discharge Medications:    Medication List         doxycycline 100 MG tablet  Commonly known as:  VIBRA-TABS  Take 1 tablet (100 mg total) by mouth every 12 (twelve) hours.     ibuprofen 800 MG tablet  Commonly known as:  ADVIL,MOTRIN  Take 1 tablet (800 mg total) by mouth 3 (three) times daily.     magic mouthwash w/lidocaine Soln  Take 10 mLs by mouth 3 (three) times daily as needed for mouth pain.     predniSONE 20 MG tablet  Commonly known as:  DELTASONE  Take 2 tablets (40 mg total) by mouth daily with breakfast.     silver sulfADIAZINE 1 % cream  Commonly known as:  SILVADENE  Apply 1 application topically 2 (two) times daily.        Discharge Instructions: Please refer to Patient Instructions section of EMR for full details.  Patient was counseled important signs and symptoms that  should prompt return to medical care, changes in medications, dietary instructions, activity restrictions, and follow up appointments.   Follow-Up Appointments: Follow-up Information   Follow up with Reita Cliche, MD On 07/21/2014. (@ 10:30 AM)    Specialty:  Nurse Practitioner   Contact information:   [address] [place] [address] [phone]       Follow up with Randel Pigg, MD On 07/22/2014. (@ 3:15 PM)    Specialty:  Dermatology   Contact information:   [place]  [id number] [phone]       Follow up with Hennie Duos, MD On 07/15/2014. (@ 1:30 PM)    Specialty:  Rheumatology   Contact information:   [place], [place] [address] [phone]       Elberta Leatherwood, MD 07/12/2014, 12:26 PM PGY-1, [place]

## 2014-07-14 LAB — RESPIRATORY VIRUS PANEL
Adenovirus: NOT DETECTED
INFLUENZA A H3: NOT DETECTED
INFLUENZA B 1: NOT DETECTED
Influenza A H1: NOT DETECTED
Influenza A: NOT DETECTED
Metapneumovirus: NOT DETECTED
Parainfluenza 1: NOT DETECTED
Parainfluenza 2: NOT DETECTED
Parainfluenza 3: NOT DETECTED
RESPIRATORY SYNCYTIAL VIRUS A: NOT DETECTED
RESPIRATORY SYNCYTIAL VIRUS B: NOT DETECTED
Rhinovirus: NOT DETECTED

## 2014-07-24 ENCOUNTER — Emergency Department (HOSPITAL_COMMUNITY)
Admission: EM | Admit: 2014-07-24 | Discharge: 2014-07-24 | Disposition: A | Discharge status: Other Acute Inpatient Hospital | Payer: BC Managed Care – PPO | Attending: Emergency Medicine | Admitting: Emergency Medicine

## 2014-07-24 ENCOUNTER — Encounter (HOSPITAL_COMMUNITY): Payer: Self-pay | Admitting: Emergency Medicine

## 2014-07-24 ENCOUNTER — Emergency Department (HOSPITAL_COMMUNITY): Payer: BC Managed Care – PPO

## 2014-07-24 DIAGNOSIS — Z792 Long term (current) use of antibiotics: Secondary | ICD-10-CM | POA: Insufficient documentation

## 2014-07-24 DIAGNOSIS — Z8619 Personal history of other infectious and parasitic diseases: Secondary | ICD-10-CM | POA: Insufficient documentation

## 2014-07-24 DIAGNOSIS — Z8739 Personal history of other diseases of the musculoskeletal system and connective tissue: Secondary | ICD-10-CM | POA: Insufficient documentation

## 2014-07-24 DIAGNOSIS — Z7952 Long term (current) use of systemic steroids: Secondary | ICD-10-CM | POA: Insufficient documentation

## 2014-07-24 DIAGNOSIS — J9601 Acute respiratory failure with hypoxia: Secondary | ICD-10-CM | POA: Diagnosis not present

## 2014-07-24 DIAGNOSIS — Z87442 Personal history of urinary calculi: Secondary | ICD-10-CM | POA: Insufficient documentation

## 2014-07-24 DIAGNOSIS — R748 Abnormal levels of other serum enzymes: Secondary | ICD-10-CM | POA: Diagnosis not present

## 2014-07-24 DIAGNOSIS — R5383 Other fatigue: Secondary | ICD-10-CM | POA: Diagnosis present

## 2014-07-24 DIAGNOSIS — R0600 Dyspnea, unspecified: Secondary | ICD-10-CM

## 2014-07-24 DIAGNOSIS — R579 Shock, unspecified: Secondary | ICD-10-CM | POA: Diagnosis not present

## 2014-07-24 DIAGNOSIS — R7989 Other specified abnormal findings of blood chemistry: Secondary | ICD-10-CM | POA: Insufficient documentation

## 2014-07-24 DIAGNOSIS — J189 Pneumonia, unspecified organism: Secondary | ICD-10-CM | POA: Insufficient documentation

## 2014-07-24 DIAGNOSIS — R778 Other specified abnormalities of plasma proteins: Secondary | ICD-10-CM

## 2014-07-24 DIAGNOSIS — R0602 Shortness of breath: Secondary | ICD-10-CM

## 2014-07-24 DIAGNOSIS — Z79899 Other long term (current) drug therapy: Secondary | ICD-10-CM | POA: Insufficient documentation

## 2014-07-24 DIAGNOSIS — Z791 Long term (current) use of non-steroidal anti-inflammatories (NSAID): Secondary | ICD-10-CM | POA: Diagnosis not present

## 2014-07-24 LAB — BLOOD GAS, VENOUS
Acid-base deficit: 2.6 mmol/L — ABNORMAL HIGH (ref 0.0–2.0)
Bicarbonate: 22.1 mEq/L (ref 20.0–24.0)
FIO2: 1 %
MECHVT: 640 mL
O2 Saturation: 80.5 %
PCO2 VEN: 40.1 mmHg — AB (ref 45.0–50.0)
PEEP: 10 cmH2O
PO2 VEN: 49.5 mmHg — AB (ref 30.0–45.0)
Patient temperature: 98.6
RATE: 20 resp/min
TCO2: 20.1 mmol/L (ref 0–100)
pH, Ven: 7.36 — ABNORMAL HIGH (ref 7.250–7.300)

## 2014-07-24 LAB — BLOOD GAS, ARTERIAL
ACID-BASE EXCESS: 0.2 mmol/L (ref 0.0–2.0)
Bicarbonate: 23.3 mEq/L (ref 20.0–24.0)
FIO2: 1 %
O2 SAT: 91 %
PATIENT TEMPERATURE: 98.6
PO2 ART: 62.5 mmHg — AB (ref 80.0–100.0)
TCO2: 20.4 mmol/L (ref 0–100)
pCO2 arterial: 34.5 mmHg — ABNORMAL LOW (ref 35.0–45.0)
pH, Arterial: 7.444 (ref 7.350–7.450)

## 2014-07-24 LAB — HEPATIC FUNCTION PANEL
ALT: 89 U/L — AB (ref 0–53)
AST: 186 U/L — ABNORMAL HIGH (ref 0–37)
Albumin: 1.8 g/dL — ABNORMAL LOW (ref 3.5–5.2)
Alkaline Phosphatase: 171 U/L — ABNORMAL HIGH (ref 39–117)
BILIRUBIN INDIRECT: 0.4 mg/dL (ref 0.3–0.9)
BILIRUBIN TOTAL: 0.9 mg/dL (ref 0.3–1.2)
Bilirubin, Direct: 0.5 mg/dL — ABNORMAL HIGH (ref 0.0–0.3)
TOTAL PROTEIN: 5.6 g/dL — AB (ref 6.0–8.3)

## 2014-07-24 LAB — TROPONIN I: TROPONIN I: 0.42 ng/mL — AB (ref <0.30)

## 2014-07-24 LAB — BASIC METABOLIC PANEL
Anion gap: 12 (ref 5–15)
BUN: 16 mg/dL (ref 6–23)
CO2: 21 meq/L (ref 19–32)
Calcium: 7.6 mg/dL — ABNORMAL LOW (ref 8.4–10.5)
Chloride: 98 mEq/L (ref 96–112)
Creatinine, Ser: 0.66 mg/dL (ref 0.50–1.35)
GFR calc Af Amer: >90 mL/min (ref >90)
GFR calc non Af Amer: >90 mL/min (ref >90)
GLUCOSE: 135 mg/dL — AB (ref 70–99)
POTASSIUM: 3.9 meq/L (ref 3.7–5.3)
Sodium: 131 mEq/L — ABNORMAL LOW (ref 137–147)

## 2014-07-24 LAB — CBC WITH DIFFERENTIAL/PLATELET
BASOS PCT: 0 % (ref 0–1)
Basophils Absolute: 0 10*3/uL (ref 0.0–0.1)
EOS PCT: 0 % (ref 0–5)
Eosinophils Absolute: 0 10*3/uL (ref 0.0–0.7)
HCT: 39.1 % (ref 39.0–52.0)
HEMOGLOBIN: 13.8 g/dL (ref 13.0–17.0)
Lymphocytes Relative: 7 % — ABNORMAL LOW (ref 12–46)
Lymphs Abs: 0.9 10*3/uL (ref 0.7–4.0)
MCH: 32.5 pg (ref 26.0–34.0)
MCHC: 35.3 g/dL (ref 30.0–36.0)
MCV: 92.2 fL (ref 78.0–100.0)
Monocytes Absolute: 0.8 10*3/uL (ref 0.1–1.0)
Monocytes Relative: 6 % (ref 3–12)
Neutro Abs: 11.7 10*3/uL — ABNORMAL HIGH (ref 1.7–7.7)
Neutrophils Relative %: 87 % — ABNORMAL HIGH (ref 43–77)
Platelets: 94 10*3/uL — ABNORMAL LOW (ref 150–400)
RBC: 4.24 MIL/uL (ref 4.22–5.81)
RDW: 15.1 % (ref 11.5–15.5)
WBC: 13.4 10*3/uL — AB (ref 4.0–10.5)

## 2014-07-24 LAB — URINALYSIS, ROUTINE W REFLEX MICROSCOPIC
Glucose, UA: NEGATIVE mg/dL
Ketones, ur: NEGATIVE mg/dL
Leukocytes, UA: NEGATIVE
NITRITE: NEGATIVE
SPECIFIC GRAVITY, URINE: 1.027 (ref 1.005–1.030)
UROBILINOGEN UA: 2 mg/dL — AB (ref 0.0–1.0)
pH: 6 (ref 5.0–8.0)

## 2014-07-24 LAB — PRO B NATRIURETIC PEPTIDE: PRO B NATRI PEPTIDE: 165.8 pg/mL — AB (ref 0–125)

## 2014-07-24 LAB — URINE MICROSCOPIC-ADD ON

## 2014-07-24 LAB — I-STAT CG4 LACTIC ACID, ED: LACTIC ACID, VENOUS: 2.25 mmol/L — AB (ref 0.5–2.2)

## 2014-07-24 LAB — CK: Total CK: 151 U/L (ref 7–232)

## 2014-07-24 MED ORDER — SODIUM CHLORIDE 0.9 % IV SOLN
INTRAVENOUS | Status: AC | PRN
  Administered 2014-07-24: 1000 mL via INTRAVENOUS
  Administered 2014-07-24: 17:00:00 via INTRAVENOUS

## 2014-07-24 MED ORDER — PROPOFOL 10 MG/ML IV EMUL
INTRAVENOUS | Status: AC
  Filled 2014-07-24: qty 100

## 2014-07-24 MED ORDER — PROPOFOL 10 MG/ML IV EMUL
5.0000 ug/kg/min | Freq: Once | INTRAVENOUS | Status: DC
Start: 2014-07-24 — End: 2014-07-24

## 2014-07-24 MED ORDER — DEXTROSE 5 % IV SOLN
0.0000 ug/min | INTRAVENOUS | Status: DC
Start: 2014-07-24 — End: 2014-07-24
  Administered 2014-07-24: 5 ug/min via INTRAVENOUS
  Administered 2014-07-24: 20 ug/min via INTRAVENOUS
  Filled 2014-07-24: qty 4

## 2014-07-24 MED ORDER — DEXTROSE 5 % IV SOLN: 0.0000 ug/min | Freq: Once | INTRAVENOUS | Status: DC

## 2014-07-24 MED ORDER — SODIUM CHLORIDE 0.9 % IV SOLN
0.0000 ug/h | INTRAVENOUS | Status: DC
  Administered 2014-07-24: 50 ug/h via INTRAVENOUS
  Filled 2014-07-24: qty 50

## 2014-07-24 MED ORDER — FENTANYL CITRATE 0.05 MG/ML IJ SOLN
50.0000 ug | Freq: Once | INTRAMUSCULAR | Status: AC
  Administered 2014-07-24: 50 ug via INTRAVENOUS
  Filled 2014-07-24: qty 2

## 2014-07-24 MED ORDER — ETOMIDATE 2 MG/ML IV SOLN
INTRAVENOUS | Status: DC
Start: 2014-07-24 — End: 2014-07-24
  Filled 2014-07-24: qty 20

## 2014-07-24 MED ORDER — ASPIRIN 300 MG RE SUPP
300.0000 mg | Freq: Once | RECTAL | Status: AC
  Administered 2014-07-24: 300 mg via RECTAL
  Filled 2014-07-24: qty 1

## 2014-07-24 MED ORDER — PIPERACILLIN-TAZOBACTAM 3.375 G IVPB 30 MIN
3.3750 g | Freq: Once | INTRAVENOUS | Status: AC
  Administered 2014-07-24: 3.375 g via INTRAVENOUS
  Filled 2014-07-24: qty 50

## 2014-07-24 MED ORDER — MIDAZOLAM HCL 2 MG/2ML IJ SOLN
2.0000 mg | Freq: Once | INTRAMUSCULAR | Status: AC
  Administered 2014-07-24: 2 mg via INTRAVENOUS
  Filled 2014-07-24: qty 2

## 2014-07-24 MED ORDER — ALBUMIN HUMAN 5 % IV SOLN
25.0000 g | Freq: Once | INTRAVENOUS | Status: DC
  Filled 2014-07-24: qty 500

## 2014-07-24 MED ORDER — ROCURONIUM BROMIDE 50 MG/5ML IV SOLN
INTRAVENOUS | Status: AC
  Filled 2014-07-24: qty 2

## 2014-07-24 MED ORDER — SODIUM CHLORIDE 0.9 % IV SOLN
1.0000 mg/h | INTRAVENOUS | Status: DC
  Administered 2014-07-24: 1 mg/h via INTRAVENOUS
  Filled 2014-07-24: qty 10

## 2014-07-24 MED ORDER — LIDOCAINE HCL (CARDIAC) 20 MG/ML IV SOLN
INTRAVENOUS | Status: DC
Start: 2014-07-24 — End: 2014-07-24
  Filled 2014-07-24: qty 5

## 2014-07-24 MED ORDER — SUCCINYLCHOLINE CHLORIDE 20 MG/ML IJ SOLN
INTRAMUSCULAR | Status: DC
Start: 2014-07-24 — End: 2014-07-24
  Filled 2014-07-24: qty 1

## 2014-07-24 MED ORDER — ETOMIDATE 2 MG/ML IV SOLN
INTRAVENOUS | Status: AC | PRN
  Administered 2014-07-24: 20 mg via INTRAVENOUS

## 2014-07-24 MED ORDER — METHYLPREDNISOLONE SODIUM SUCC 125 MG IJ SOLR
125.0000 mg | Freq: Once | INTRAMUSCULAR | Status: AC
  Administered 2014-07-24: 125 mg via INTRAVENOUS
  Filled 2014-07-24: qty 2

## 2014-07-24 MED ORDER — FENTANYL BOLUS VIA INFUSION
50.0000 ug | INTRAVENOUS | Status: DC | PRN
  Filled 2014-07-24: qty 100

## 2014-07-24 MED ORDER — ROCURONIUM BROMIDE 50 MG/5ML IV SOLN
INTRAVENOUS | Status: AC | PRN
Start: 2014-07-24 — End: 2014-07-24
  Administered 2014-07-24: 100 mg via INTRAVENOUS

## 2014-07-24 MED ORDER — FENTANYL CITRATE 0.05 MG/ML IJ SOLN
50.0000 ug | Freq: Once | INTRAMUSCULAR | Status: DC
  Filled 2014-07-24: qty 2

## 2014-07-24 MED ORDER — VANCOMYCIN HCL IN DEXTROSE 1-5 GM/200ML-% IV SOLN
1000.0000 mg | Freq: Once | INTRAVENOUS | Status: AC
  Administered 2014-07-24: 1000 mg via INTRAVENOUS
  Filled 2014-07-24: qty 200

## 2014-07-24 MED ORDER — SODIUM CHLORIDE 0.9 % IV BOLUS (SEPSIS)
1000.0000 mL | Freq: Once | INTRAVENOUS | Status: AC
  Administered 2014-07-24: 1000 mL via INTRAVENOUS

## 2014-07-24 MED ORDER — PIPERACILLIN-TAZOBACTAM 4.5 G IVPB: 4.5000 g | Freq: Once | INTRAVENOUS | Status: DC

## 2014-07-24 MED ORDER — PROPOFOL 10 MG/ML IV EMUL
5.0000 ug/kg/min | Freq: Once | INTRAVENOUS | Status: AC
  Administered 2014-07-24: 30 ug/kg/min via INTRAVENOUS
  Administered 2014-07-24: 5 ug/kg/min via INTRAVENOUS
  Administered 2014-07-24: 35 ug/kg/min via INTRAVENOUS
  Administered 2014-07-24: 20 ug/kg/min via INTRAVENOUS

## 2014-07-24 NOTE — ED Notes (Signed)
Pt alert and oriented x4. Pt denies pain at present time. Nanavati MD at bedside explaining plan of care with patient.

## 2014-07-24 NOTE — ED Notes (Signed)
[facility] are currently getting discharges and will call with bed assignment when able/available

## 2014-07-24 NOTE — ED Provider Notes (Addendum)
CSN: [id number]     Arrival date & time 07/24/14  1157 History   First MD Initiated Contact with Patient 07/24/14 1214     Chief Complaint  Patient presents with  . Fall  . Fatigue     (Consider location/radiation/quality/duration/timing/severity/associated sxs/prior Treatment) HPI Comments: Pt with recent decline is health, due to concerns for autoimmune disease comes in with cc of weakness, syncope. PT was heading to the bathroom, got diaphoretic, and passed out. He had no chest pain, shortness of breath - however, he is visibly dyspneic. He has no cardiac hx. Pt was admitted recently to our hospital, and is getting [facility] and is scheduled for muscle biopsy. He was taken off of prednisone (not weaned) recently, for the biopsy.  The history is provided by the patient and the spouse.    Past Medical History  Diagnosis Date  . Kidney stone   . Gout   . Oral thrush     "last 5 weeks" (07/08/2014)   Past Surgical History  Procedure Laterality Date  . Kidney stone surgery  1990's or before    "cut my side"  . Anterior cervical decomp/discectomy fusion  1990's?  . Back surgery     Family History  Problem Relation Age of Onset  . Diabetes Other   . Cancer Father     lung   History  Substance Use Topics  . Smoking status: Never Smoker   . Smokeless tobacco: Never Used  . Alcohol Use: No    Review of Systems  Constitutional: Positive for diaphoresis, activity change and fatigue. Negative for fever and chills.  HENT: Negative for trouble swallowing.   Eyes: Negative for visual disturbance.  Respiratory: Positive for wheezing. Negative for cough and chest tightness.   Cardiovascular: Negative for chest pain.  Gastrointestinal: Negative for nausea, vomiting and abdominal distention.  Genitourinary: Negative for dysuria, enuresis and difficulty urinating.  Musculoskeletal: Negative for arthralgias and neck pain.  Allergic/Immunologic: Negative for  immunocompromised state.  Neurological: Positive for weakness. Negative for light-headedness and headaches.  Psychiatric/Behavioral: Negative for confusion.      Allergies  Review of patient's allergies indicates no known allergies.  Home Medications   Prior to Admission medications   Medication Sig Start Date End Date Taking? Authorizing Provider  Alum & Mag Hydroxide-Simeth (MAGIC MOUTHWASH W/LIDOCAINE) SOLN Take 10 mLs by mouth 3 (three) times daily as needed for mouth pain.   Yes Historical Provider, MD  amoxicillin-clavulanate (AUGMENTIN) 875-125 MG per tablet Take 1 tablet by mouth 2 (two) times daily.   Yes Historical Provider, MD  clobetasol ointment (TEMOVATE) 0.05 % Apply 1 application topically 2 (two) times daily as needed (for sores in mouth).   Yes Historical Provider, MD  clotrimazole (MYCELEX) 10 MG troche Take 10 mg by mouth daily.   Yes Historical Provider, MD  doxycycline (VIBRA-TABS) 100 MG tablet Take 1 tablet (100 mg total) by mouth every 12 (twelve) hours. 07/11/14  Yes Kathee DeltonIan D McKeag, MD  ibuprofen (ADVIL,MOTRIN) 800 MG tablet Take 1 tablet (800 mg total) by mouth 3 (three) times daily. 06/10/14  Yes April K Palumbo-Rasch, MD  predniSONE (DELTASONE) 20 MG tablet Take 2 tablets (40 mg total) by mouth daily with breakfast. 07/11/14  Yes Kathee DeltonIan D McKeag, MD  silver sulfADIAZINE (SILVADENE) 1 % cream Apply 1 application topically 2 (two) times daily.   Yes Historical Provider, MD   BP 119/73  Pulse 90  Temp(Src) 97.8 F (36.6 C) (Oral)  Resp 23  Ht 6\' 1"  (1.854 m)  Wt 207 lb (93.895 kg)  BMI 27.32 kg/m2  SpO2 98% Physical Exam  Constitutional: He is oriented to person, place, and time. He appears well-developed.  HENT:  Head: Normocephalic and atraumatic.  Eyes: Conjunctivae and EOM are normal. Pupils are equal, round, and reactive to light.  Neck: Normal range of motion. Neck supple.  Cardiovascular: Regular rhythm.   Pulmonary/Chest: He is in respiratory distress.  He has wheezes. He has no rales.  Abdominal: Soft. Bowel sounds are normal. He exhibits no distension. There is no tenderness. There is no rebound and no guarding.  Neurological: He is alert and oriented to person, place, and time. No cranial nerve deficit.  Skin: Skin is warm. He is diaphoretic.  Nursing note and vitals reviewed.   ED Course  ARTERIAL BLOOD GAS Date/Time: 07/24/2014 6:24 PM Performed by: Derwood Kaplan Authorized by: Derwood Kaplan Consent: Verbal consent obtained. Risks and benefits: risks, benefits and alternatives were discussed Consent given by: patient Patient understanding: patient states understanding of the procedure being performed Patient identity confirmed: verbally with patient Time out: Immediately prior to procedure a "time out" was called to verify the correct patient, procedure, equipment, support staff and site/side marked as required. Location: right radial Allen's test normal: yes Number of attempts: 1 Manual pressure: manual pressure applied for more than 5 minutes Post-procedure: dressing applied Post-procedure CMS: normal Patient tolerance: Patient tolerated the procedure well with no immediate complications.    CENTRAL LINE Performed by: Derwood Kaplan Consent: The procedure was performed in an emergent situation. Required items: required blood products, implants, devices, and special equipment available Patient identity confirmed: arm band and provided demographic data Time out: Immediately prior to procedure a "time out" was called to verify the correct patient, procedure, equipment, support staff and site/side marked as required. Indications: vascular access Anesthesia: local infiltration Local anesthetic: lidocaine 1% with epinephrine Anesthetic total: 3 ml Patient sedated: no Preparation: skin prepped with 2% chlorhexidine Skin prep agent dried: skin prep agent completely dried prior to procedure Sterile barriers: all five  maximum sterile barriers used - cap, mask, sterile gown, sterile gloves, and large sterile sheet Hand hygiene: hand hygiene performed prior to central venous catheter insertion  Location details: right IJ  Catheter type: triple lumen Catheter size: 8 Fr Pre-procedure: landmarks identified Ultrasound guidance: YES Successful placement: yes Post-procedure: line sutured and dressing applied Assessment: blood return through all parts, free fluid flow, placement verified by x-ray and no pneumothorax on x-ray Patient tolerance: Patient tolerated the procedure well with no immediate complications.   CRITICAL CARE Performed by: Derwood Kaplan   Total critical care time: 180 minutes  Critical care time was exclusive of separately billable procedures and treating other patients.  Critical care was necessary to treat or prevent imminent or life-threatening deterioration.  Critical care was time spent personally by me on the following activities: development of treatment plan with patient and/or surrogate as well as nursing, discussions with consultants, evaluation of patient's response to treatment, examination of patient, obtaining history from patient or surrogate, ordering and performing treatments and interventions, ordering and review of laboratory studies, ordering and review of radiographic studies, pulse oximetry and re-evaluation of patient's condition.     (including critical care time) Labs Review Labs Reviewed  CBC WITH DIFFERENTIAL - Abnormal; Notable for the following:    WBC 13.4 (*)    Platelets 94 (*)    Neutrophils Relative % 87 (*)    Lymphocytes  Relative 7 (*)    Neutro Abs 11.7 (*)    All other components within normal limits  BASIC METABOLIC PANEL - Abnormal; Notable for the following:    Sodium 131 (*)    Glucose, Bld 135 (*)    Calcium 7.6 (*)    All other components within normal limits  TROPONIN I - Abnormal; Notable for the following:    Troponin I 0.42  (*)    All other components within normal limits  PRO B NATRIURETIC PEPTIDE - Abnormal; Notable for the following:    Pro B Natriuretic peptide (BNP) 165.8 (*)    All other components within normal limits  BLOOD GAS, ARTERIAL - Abnormal; Notable for the following:    pCO2 arterial 34.5 (*)    pO2, Arterial 62.5 (*)    All other components within normal limits  HEPATIC FUNCTION PANEL - Abnormal; Notable for the following:    Total Protein 5.6 (*)    Albumin 1.8 (*)    AST 186 (*)    ALT 89 (*)    Alkaline Phosphatase 171 (*)    Bilirubin, Direct 0.5 (*)    All other components within normal limits  URINALYSIS, ROUTINE W REFLEX MICROSCOPIC - Abnormal; Notable for the following:    Color, Urine ORANGE (*)    APPearance CLOUDY (*)    Hgb urine dipstick MODERATE (*)    Bilirubin Urine SMALL (*)    Protein, ur >300 (*)    Urobilinogen, UA 2.0 (*)    All other components within normal limits  URINE MICROSCOPIC-ADD ON - Abnormal; Notable for the following:    Bacteria, UA FEW (*)    Casts GRANULAR CAST (*)    All other components within normal limits  BLOOD GAS, VENOUS - Abnormal; Notable for the following:    pH, Ven 7.360 (*)    pCO2, Ven 40.1 (*)    pO2, Ven 49.5 (*)    Acid-base deficit 2.6 (*)    All other components within normal limits  I-STAT CG4 LACTIC ACID, ED - Abnormal; Notable for the following:    Lactic Acid, Venous 2.25 (*)    All other components within normal limits  CULTURE, BLOOD (ROUTINE X 2)  CULTURE, BLOOD (ROUTINE X 2)  URINE CULTURE  URINE CULTURE  CK  URINALYSIS, ROUTINE W REFLEX MICROSCOPIC  BLOOD GAS, ARTERIAL    Imaging Review Dg Chest Portable 1 View  07/24/2014   CLINICAL DATA:  Fall. Fatigue. Respiratory failure, status post intubation.  EXAM: PORTABLE CHEST - 1 VIEW  COMPARISON:  07/24/2014 at 1:03 p.m.  FINDINGS: The new endotracheal tube is noted with tip 3.1 cm above the carina. Nasogastric tube tip in the stomach body.  Stable bilateral  interstitial and airspace opacities compatible with edema or multi lobar pneumonia. Indistinct left heart border.  IMPRESSION: 1. Endotracheal tube tip well positioned, 3.1 cm above the carina. Nasogastric tube tip in the stomach. Otherwise stable.   Electronically Signed   By: Herbie BaltimoreWalt  Liebkemann M.D.   On: 07/24/2014 15:04   Dg Chest Port 1 View  07/24/2014   CLINICAL DATA:  Dyspnea, pain  EXAM: PORTABLE CHEST - 1 VIEW  COMPARISON:  07/11/2014  FINDINGS: There are bilateral interstitial and alveolar airspace opacities, left greater than right. There is no definite pleural effusion. There is no pneumothorax. The heart and mediastinal contours are unremarkable.  The osseous structures are unremarkable.  IMPRESSION: Bilateral interstitial and alveolar airspace opacities concerning for pulmonary edema versus multilobar  pneumonia versus pneumonitis secondary to an inflammatory process.   Electronically Signed   By: Elige Ko   On: 07/24/2014 13:36     EKG Interpretation   Date/Time:  Thursday July 24 2014 12:02:35 EDT Ventricular Rate:  141 PR Interval:  123 QRS Duration: 74 QT Interval:  336 QTC Calculation: 515 R Axis:   13 Text Interpretation:  Sinus tachycardia Borderline T abnormalities,  diffuse leads Prolonged QT interval Baseline wander in lead(s) II aVF  Confirmed by Rhunette Croft, MD, Janey Genta [phone]) on 07/24/2014 1:20:41 PM        MDM   Final diagnoses:  Dyspnea  Acute respiratory failure with hypoxia  Shock  Elevated troponin  Elevated liver enzymes  Pneumonitis    Pt comes in with cc of near syncope. Pt was a healthy man, until the last few weeks. 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  Pt arrives tachycardic, febrile and is in hypoxic resp failure. He was 70s % on room air, 88% on NRB, and with Bipap - low 90s. CXR shows diffuse pulm edema, labs show mild leukocytosis and elevated liver enz  and trop. OF NOTE - pt was stopped on prednisone abruptly, after 3 weeks of use, so that he can get muscle biopsy done tomorrow by Dr. Barnetta Chapel, Gerline Legacy with our surgical and medicine team - surgeons don't typically do the biopsy, there is no in house Rheum. Spoke with Dr. Maple Hudson, at [facility] - and they will accept the patient.  Pt was eventually intubated, for hypoxia. BP was stable. IV antibiotics started. IV fluids started. Rectal ASA given.  There are no ICU beds at [facility] ICU. Overtime, pt's BP started dropping, and he became more hypoxic. We started bagging the patient, stress dose steroid - in form of solumedrol started. Propofol stopped, switched to versed and fent. Norepi ordered.  CCM spoken to, Dr. Tyson Alias to evaluate. PEEP at 10, FiO2 100%, RR 16.      Derwood Kaplan, MD 07/24/14 6045  Derwood Kaplan, MD 08/20/14 [phone]

## 2014-07-24 NOTE — ED Notes (Signed)
[facility] at bedside. Versed titrated 3 mg/hr by [facility].

## 2014-07-24 NOTE — ED Notes (Signed)
Pt sats on room air down to 76%; pt placed back on NRB

## 2014-07-24 NOTE — ED Notes (Signed)
[facility] at bedside inserting central line at present time.

## 2014-07-24 NOTE — ED Notes (Signed)
Pt on ventilator. No bag mask valve.

## 2014-07-24 NOTE — Progress Notes (Signed)
Peak flow = 400 L/min on both of 2 good efforts. Pt Sp02 = 84 while not on Bipap.

## 2014-07-24 NOTE — ED Notes (Signed)
Glenn Cooper got the urine culture at 1540.

## 2014-07-24 NOTE — ED Notes (Signed)
Pt continues to speak in full sentences.

## 2014-07-24 NOTE — ED Notes (Addendum)
During downtime propofol titrated  1601 35 mcg/kg/min 1606 45 mcg/kg/min 1612 55 mcg/kg/min 1632 65 mcg/kg min  From placement of airway to departure pt color pink, moves slightly at times, medications adjusted per pt needs and comfort.

## 2014-07-24 NOTE — ED Notes (Signed)
Wife at bedside at present time.

## 2014-07-24 NOTE — ED Notes (Signed)
Pt given zofran 4mg  en route; pt states continues to have dizziness; nausea better

## 2014-07-24 NOTE — ED Notes (Signed)
Bag mask value per respiratory saturation 84% on ventilator.

## 2014-07-24 NOTE — ED Notes (Signed)
Bed: WA21 Expected date: 07/24/14 Expected time: 11:28 AM Means of arrival: Ambulance Comments: EMS weakness/fall

## 2014-07-24 NOTE — Progress Notes (Signed)
PCCM Interval Note  Asked to come to [facility] ED to admit Mr. Janee Mornhompson to an ICU bed here as there are were [facility].  While reviewing the pt's chart, [facility] RN called to get [facility] from [facility] ED RN.  [facility] by [facility].   Rutherford Guysahul Banita Lehn, [place] - [facility] Pgr: [phone]  or [phone] 07/24/2014, 6:12 PM

## 2014-07-24 NOTE — ED Notes (Signed)
[facility] at bedside. Versed titrated 2 mg/hr by [facility].

## 2014-07-24 NOTE — ED Notes (Signed)
Made [facility] EDP aware of critical troponin. No new orders given at this time.

## 2014-07-24 NOTE — ED Notes (Signed)
Pt got up to bathroom; got dizzy and fell; hit head when he fell with hematoma to left eyebrow; denies neck pain;denies any other c/o pain; assisted to bed; on arrival ems states pt alert on arrival; sat 70% on room air; continues to c/o weakness; states pneumonia x 2 months; history of rash and fever x 2 months; has been being [facility] with no definitive diagnosis; albuterol 5 given in route; placed on NRB en route; cbg 157; ekg sinus tach; pt alert and oriented on arrival to ER; bp 98/57 on arrival

## 2014-07-25 LAB — BLOOD GAS, ARTERIAL
Acid-base deficit: 2.6 mmol/L — ABNORMAL HIGH (ref 0.0–2.0)
Bicarbonate: 22.6 mEq/L (ref 20.0–24.0)
Drawn by: 276051
FIO2: 1 %
O2 SAT: 92.9 %
PATIENT TEMPERATURE: 98.6
PEEP: 5 cmH2O
RATE: 20 resp/min
TCO2: 20.4 mmol/L (ref 0–100)
VT: 640 mL
pCO2 arterial: 42.9 mmHg (ref 35.0–45.0)
pH, Arterial: 7.341 — ABNORMAL LOW (ref 7.350–7.450)
pO2, Arterial: 75.7 mmHg — ABNORMAL LOW (ref 80.0–100.0)

## 2014-07-25 LAB — URINE CULTURE
COLONY COUNT: NO GROWTH
Culture: NO GROWTH

## 2014-07-30 LAB — CULTURE, BLOOD (ROUTINE X 2): CULTURE: NO GROWTH

## 2014-07-31 LAB — CULTURE, BLOOD (ROUTINE X 2): Culture: NO GROWTH

## 2014-08-03 DEATH — deceased

## 2015-03-04 IMAGING — CR DG CHEST 2V
2 series · 2 of 2 positions shown · non-contrast
Comparison: 05/20/2010

CLINICAL DATA: Sharp mid chest pain with inspiration

EXAM:
CHEST  2 VIEW

[w chest pa]
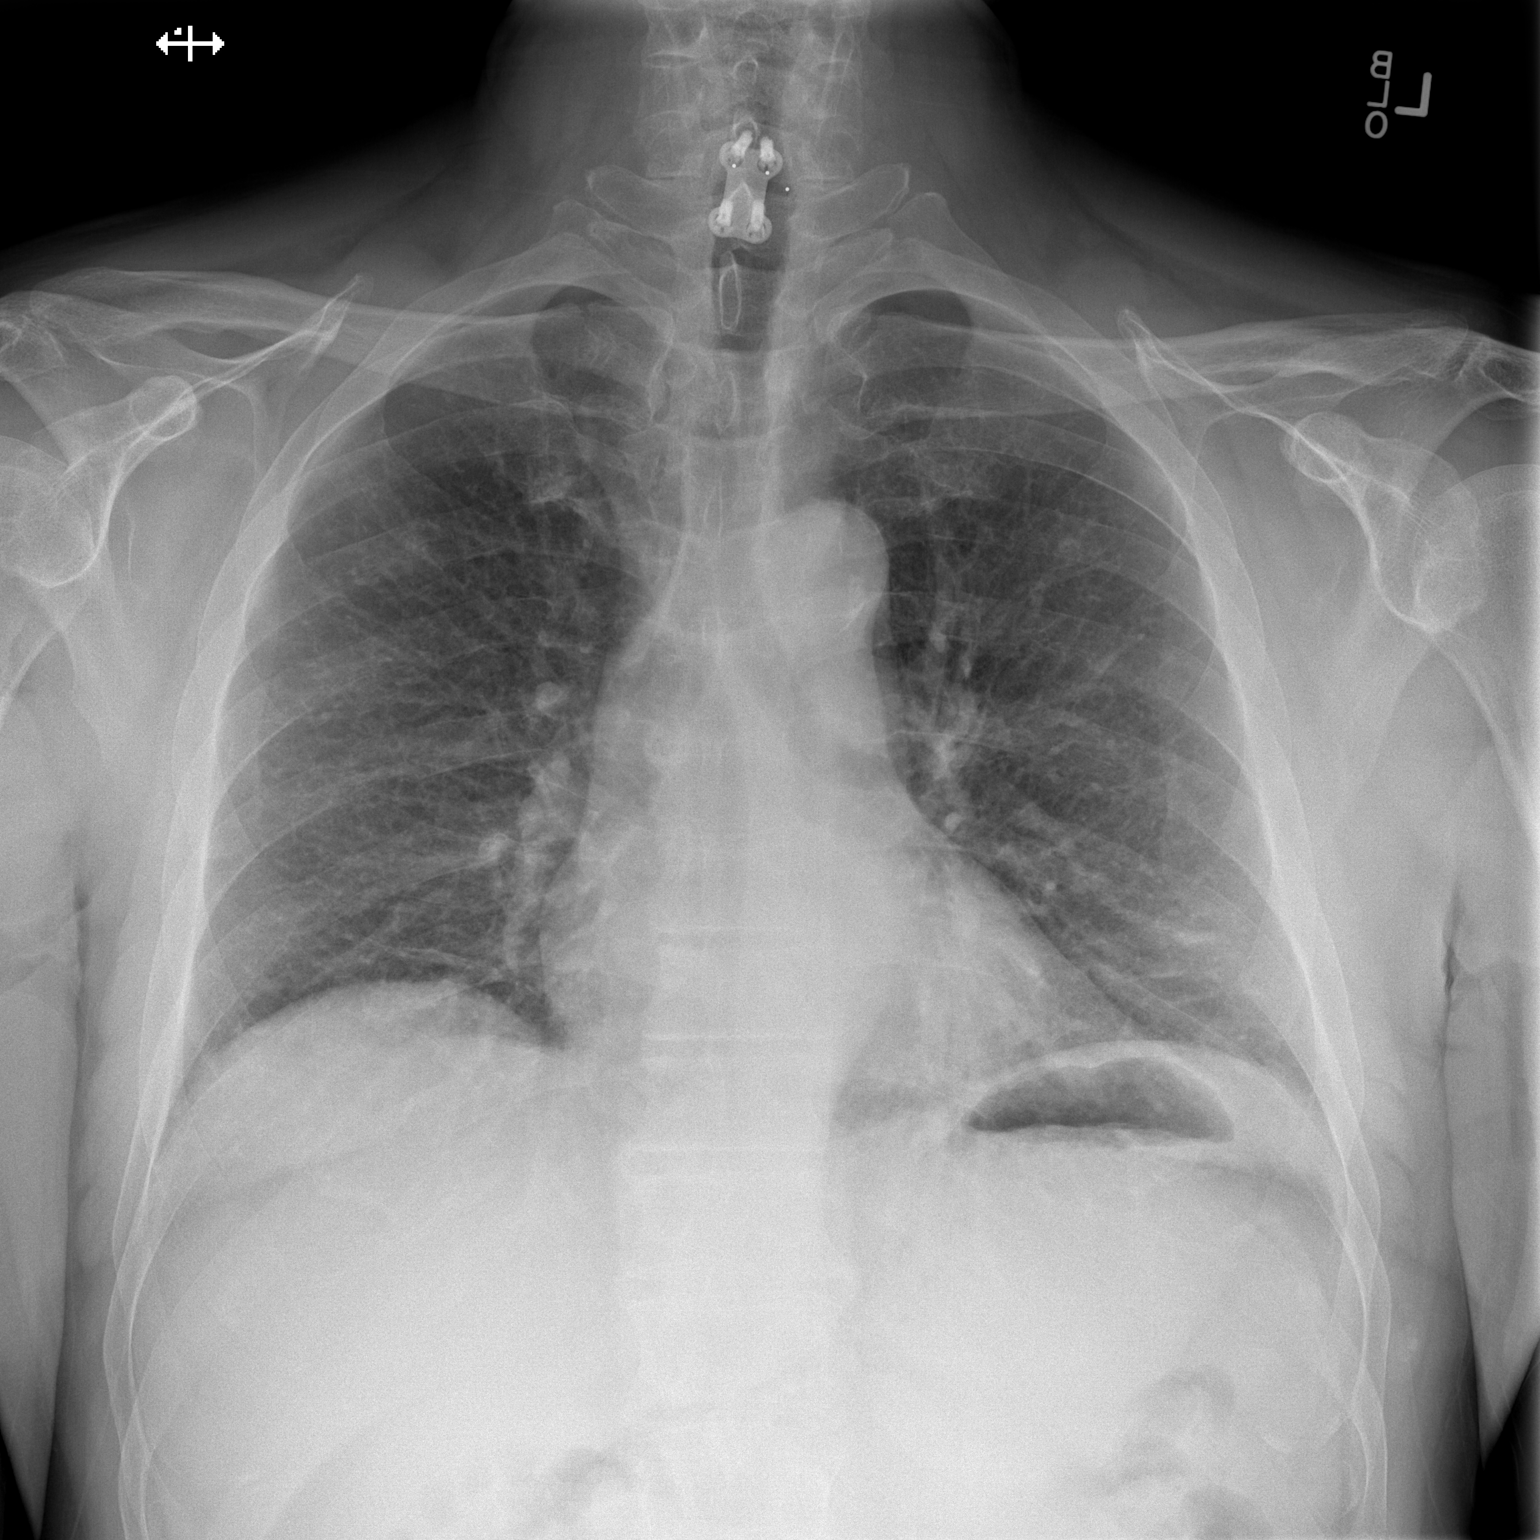

[w chest lat]
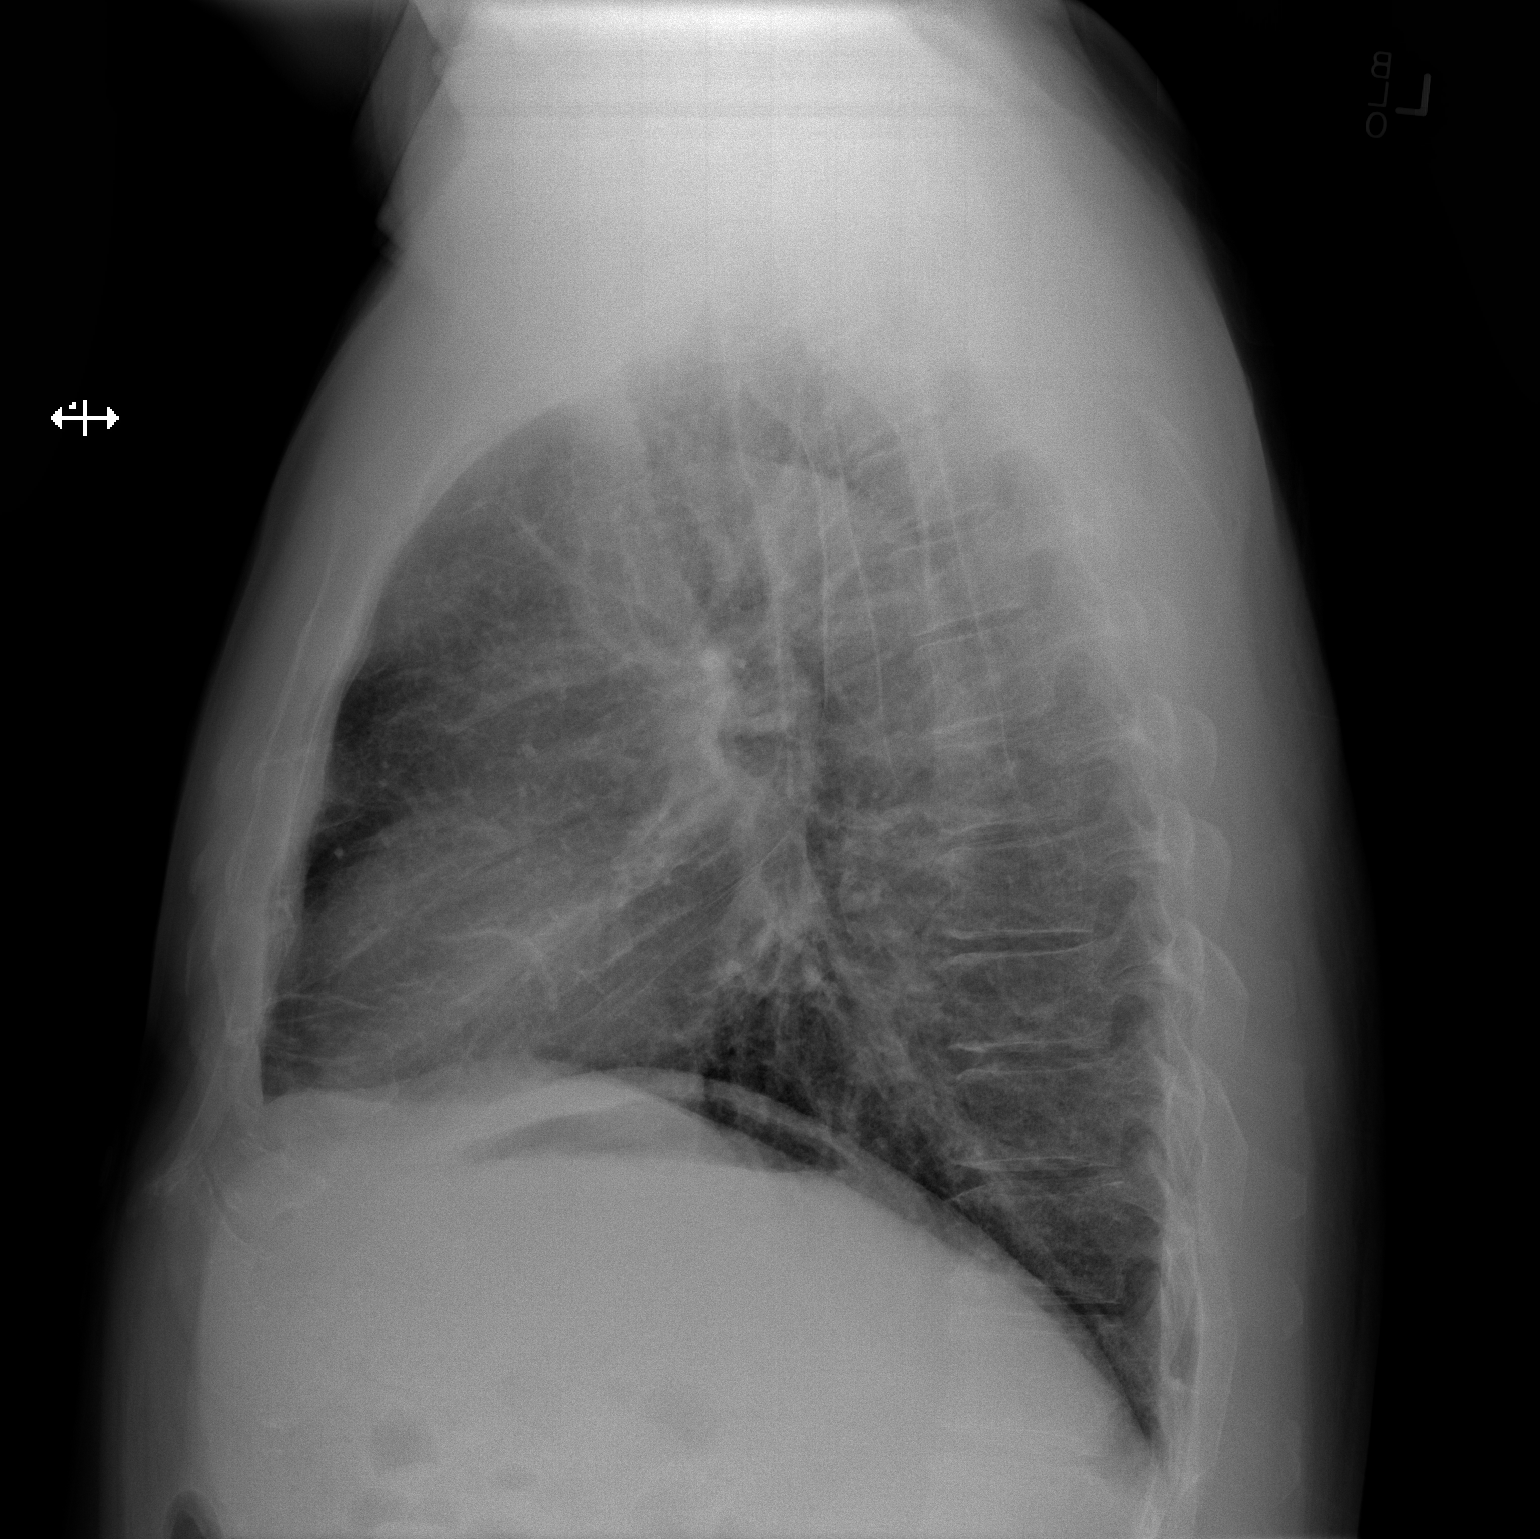

[2 of 2 positions shown; findings below may reference images not displayed]

FINDINGS: Mild bibasilar opacities, likely atelectasis. No pleural effusion or
pneumothorax.

The heart is normal in size.

Mild degenerative changes of the visualized thoracolumbar spine.
Cervical spine fixation hardware.
IMPRESSION: Mild bibasilar opacities, likely atelectasis.

## 2015-04-05 IMAGING — CR DG CHEST 2V
2 series · 2 of 2 positions shown · non-contrast
Comparison: July 10, 2014.

CLINICAL DATA: Shortness of breath, pneumonitis.

EXAM:
CHEST  2 VIEW

[w chest pa]
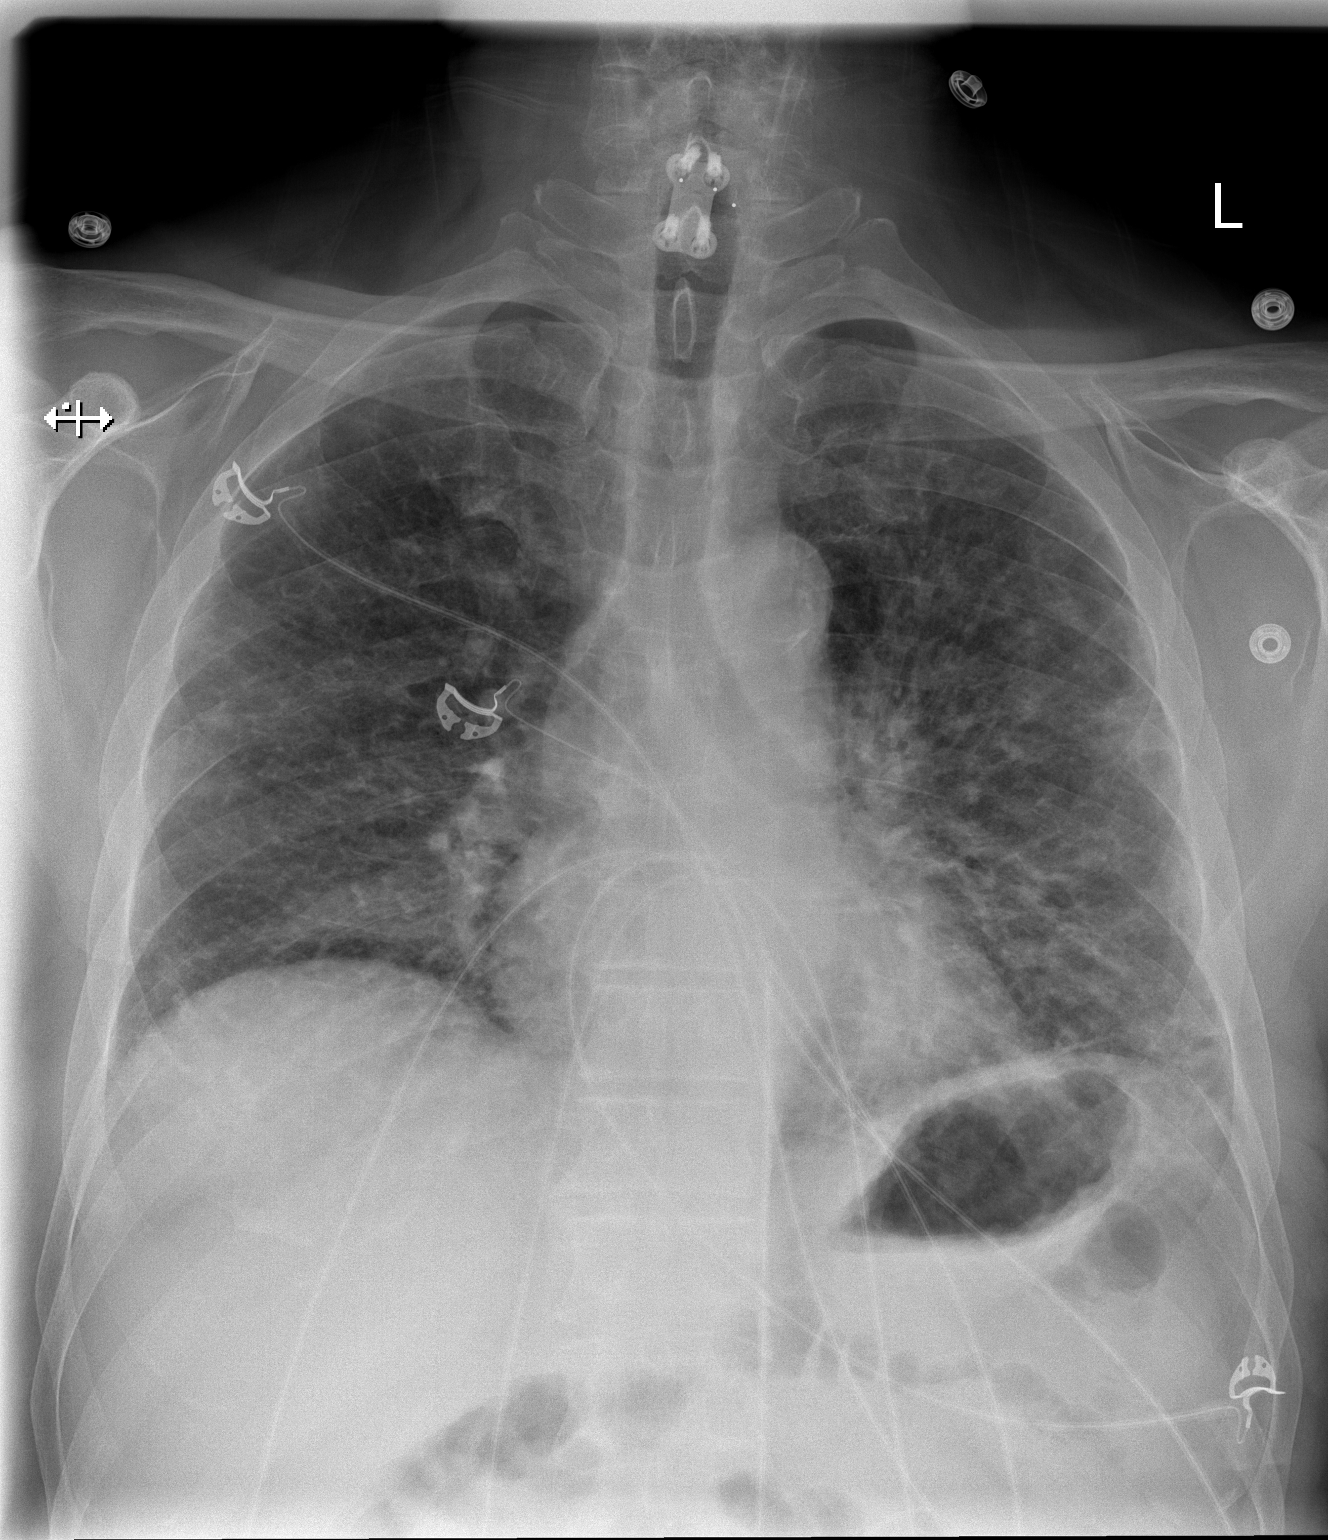

[w chest lat]
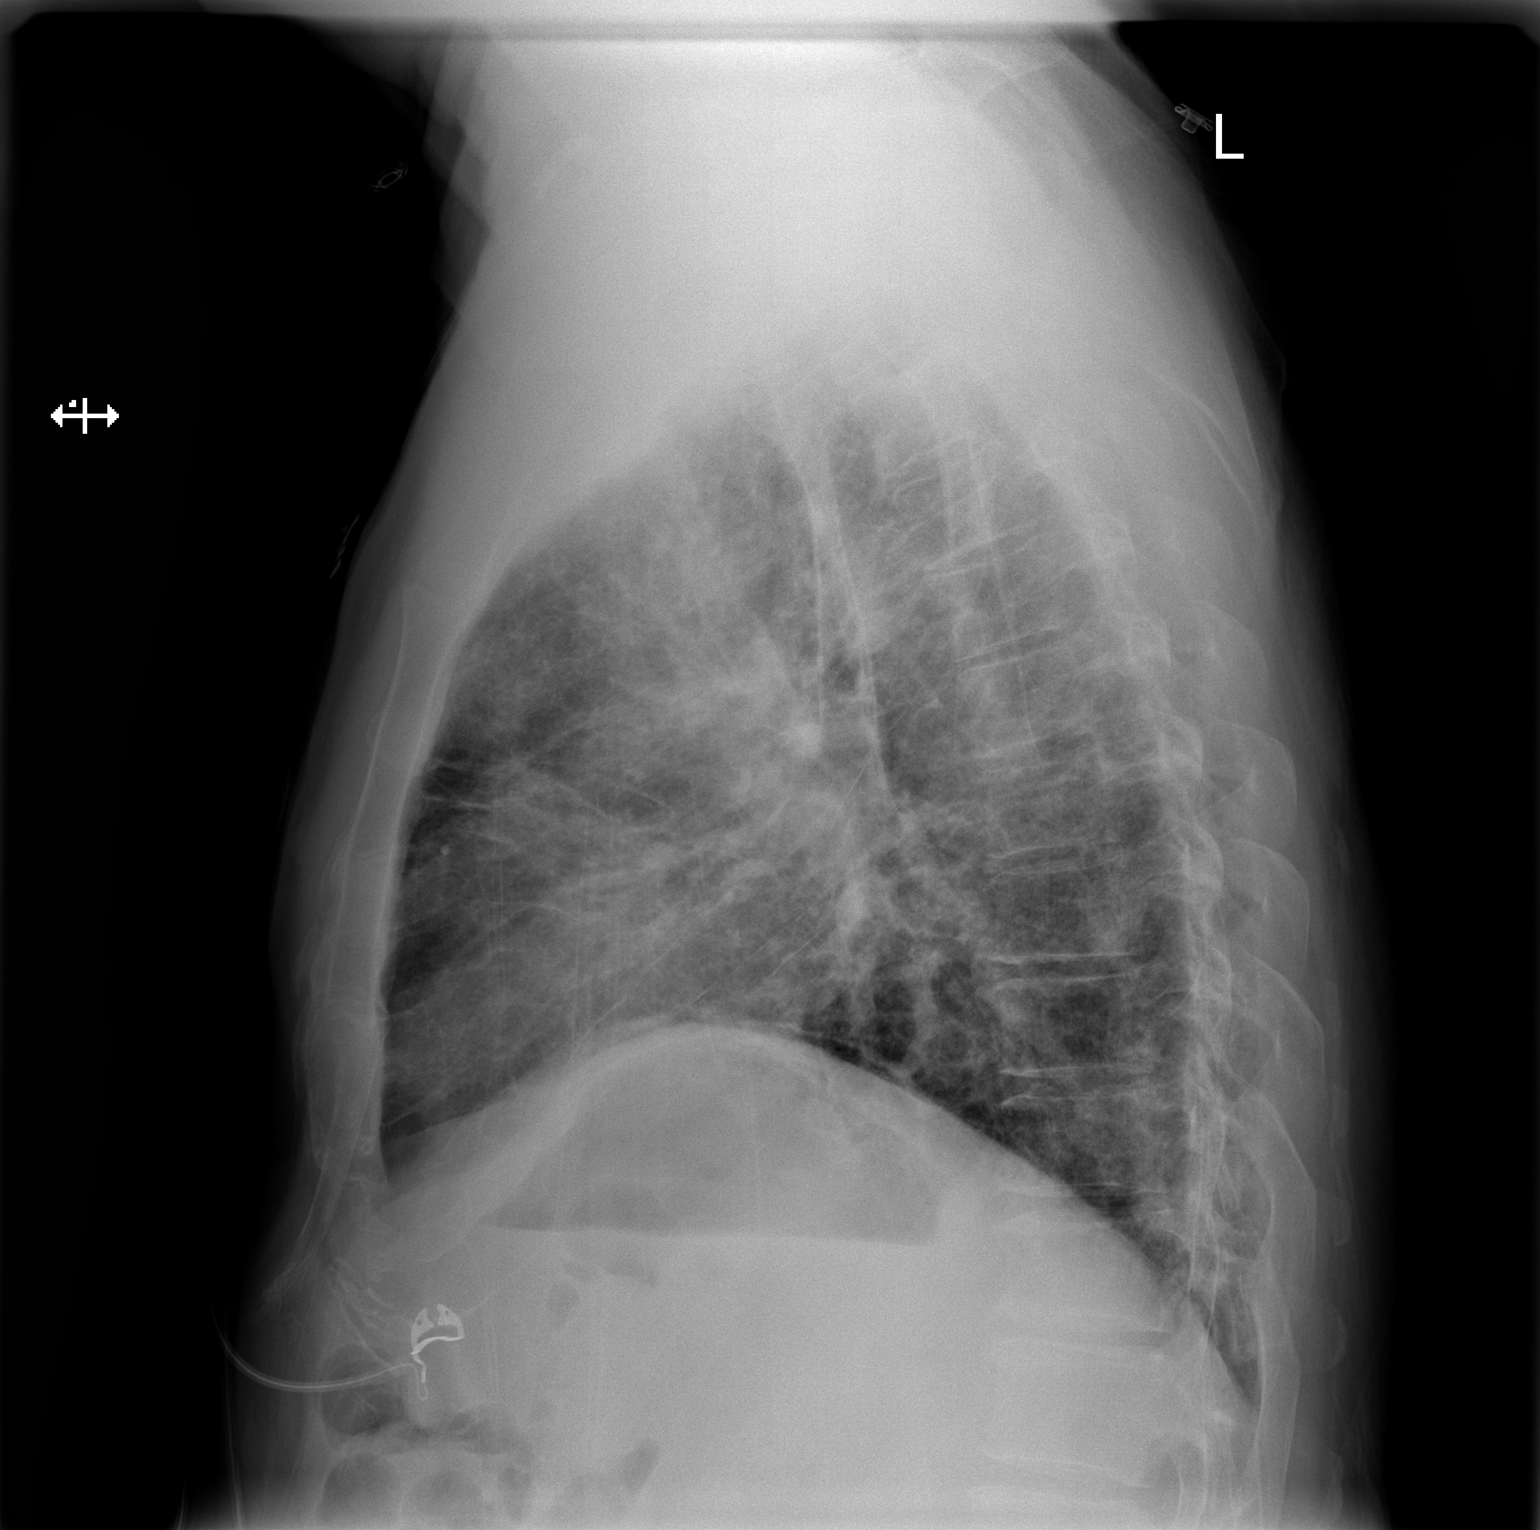

[2 of 2 positions shown; findings below may reference images not displayed]

FINDINGS: Stable cardiomediastinal silhouette. No pneumothorax or pleural
effusion is noted. Stable interstitial and nodular densities are
noted throughout both lungs most consistent with edema or atypical
inflammation. This is worse on the left than the right. Bony thorax
is intact.
IMPRESSION: Stable interstitial and nodular densities are noted throughout both
lungs concerning for pulmonary edema or atypical inflammation.
